# Patient Record
Sex: Male | Born: 1965
Health system: Southern US, Community
[De-identification: ages and names within clinical notes are randomized; demographics above are authoritative.]

## PROBLEM LIST (undated history)

## (undated) DIAGNOSIS — I10 Essential (primary) hypertension: Secondary | ICD-10-CM

## (undated) DIAGNOSIS — R0600 Dyspnea, unspecified: Secondary | ICD-10-CM

## (undated) HISTORY — PX: SPINAL FUSION: SHX223

## (undated) HISTORY — PX: KNEE SURGERY: SHX244

## (undated) HISTORY — DX: Dyspnea, unspecified: R06.00

---

## 1999-06-14 ENCOUNTER — Encounter: Payer: Self-pay | Admitting: Emergency Medicine

## 1999-06-14 ENCOUNTER — Emergency Department (HOSPITAL_COMMUNITY): Admission: EM | Admit: 1999-06-14 | Discharge: 1999-06-14 | Payer: Self-pay | Admitting: Emergency Medicine

## 2002-03-23 ENCOUNTER — Emergency Department (HOSPITAL_COMMUNITY): Admission: EM | Admit: 2002-03-23 | Discharge: 2002-03-23 | Payer: Self-pay | Admitting: Emergency Medicine

## 2004-01-05 ENCOUNTER — Encounter: Admission: RE | Admit: 2004-01-05 | Discharge: 2004-01-05 | Payer: Self-pay | Admitting: Cardiology

## 2004-08-15 IMAGING — CR DG CHEST 2V
2 series · 2 of 2 positions shown · non-contrast
Comparison: None.

CLINICAL DATA: Cough, congestion, shortness of breath, chest pain and tightness. 
 TWO VIEW CHEST

[view not recorded (1 of 2)]
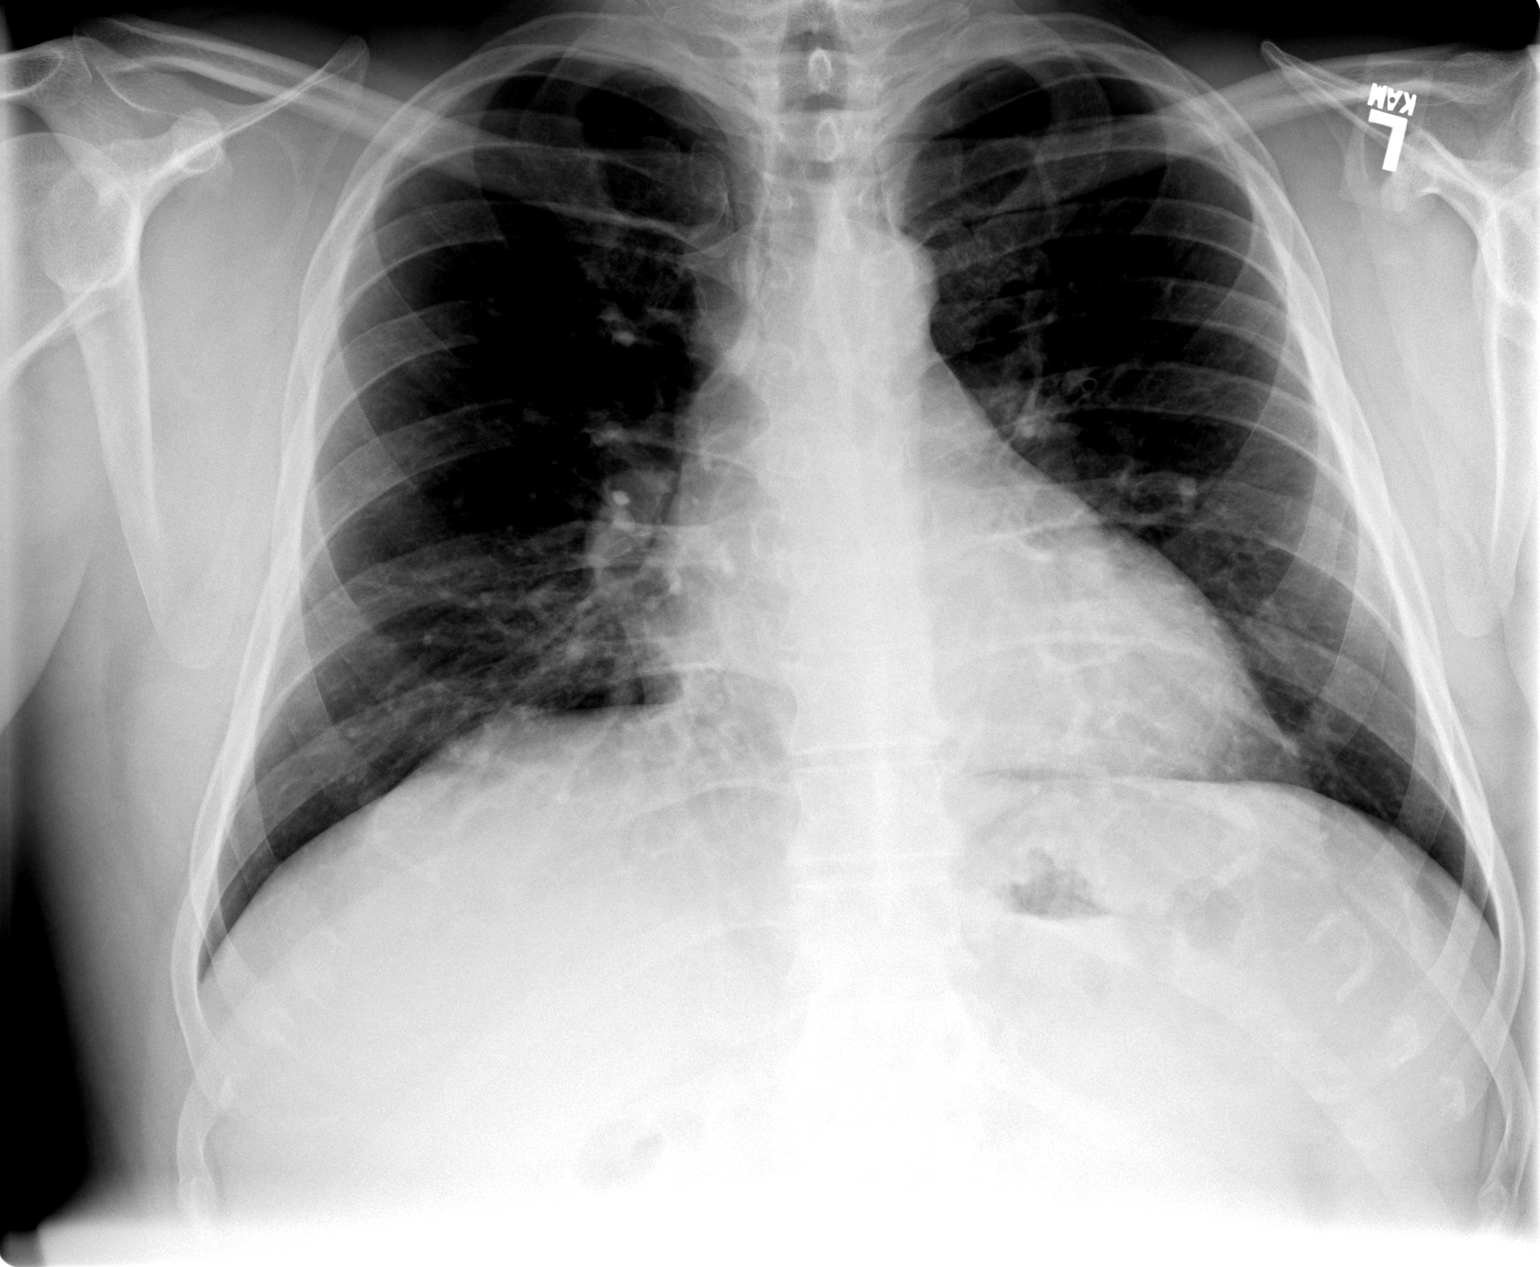

[view not recorded (2 of 2)]
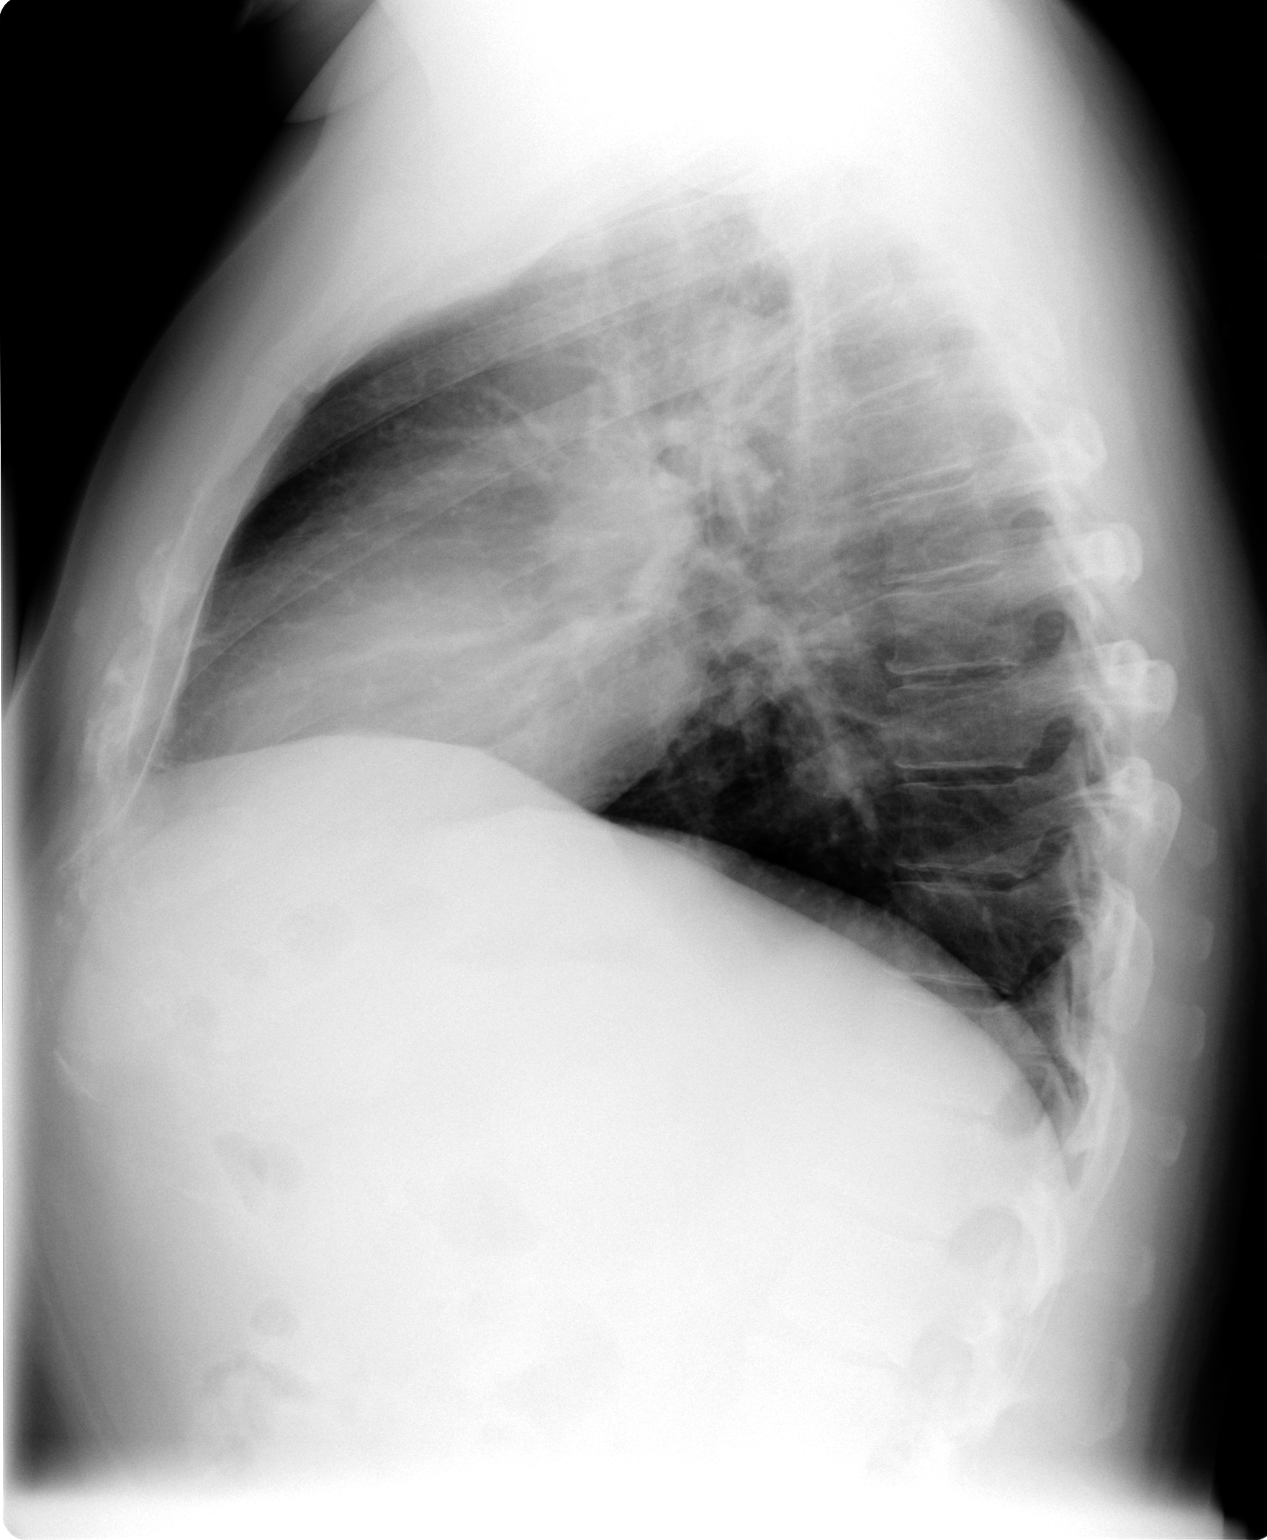

[2 of 2 positions shown; findings below may reference images not displayed]

FINDINGS: Two view chest shows low volumes which crowd lung bases.  Cardiopericardial silhouette is within normal limits for size.  There is no pulmonary edema or pleural effusion.  No evidence for focal consolidation.  Imaged bony structures are intact. 
 IMPRESSION
 Low volume film without evidence of acute cardiopulmonary process.

## 2009-07-28 ENCOUNTER — Ambulatory Visit: Payer: Self-pay | Admitting: Unknown Physician Specialty

## 2010-08-26 ENCOUNTER — Ambulatory Visit: Payer: Self-pay | Admitting: Family Medicine
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
## 2010-08-27 ENCOUNTER — Encounter (INDEPENDENT_AMBULATORY_CARE_PROVIDER_SITE_OTHER): Payer: BC Managed Care – PPO

## 2010-08-27 DIAGNOSIS — M7989 Other specified soft tissue disorders: Secondary | ICD-10-CM
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
## 2010-10-14 ENCOUNTER — Ambulatory Visit: Payer: Self-pay | Admitting: Family Medicine

## 2010-10-14 IMAGING — CR DG CHEST 2V
1 series · 3 of 3 positions shown · non-contrast
Comparison: none

REASON FOR EXAM: neck pain/chest wall pain
COMMENTS:

PROCEDURE:     MDR - MDR CHEST PA(OR AP) AND LATERAL  - [DATE]  [DATE]
RESULT:     The lung fields are clear. No pneumonia, pneumothorax or pleural
effusion is seen. The heart is upper limits for normal in size. The
mediastinal and osseous structures show no significant abnormalities.

[Series 1: view not recorded · 0.17mm/px · 3 of 3 slices shown]
[im 1/3]
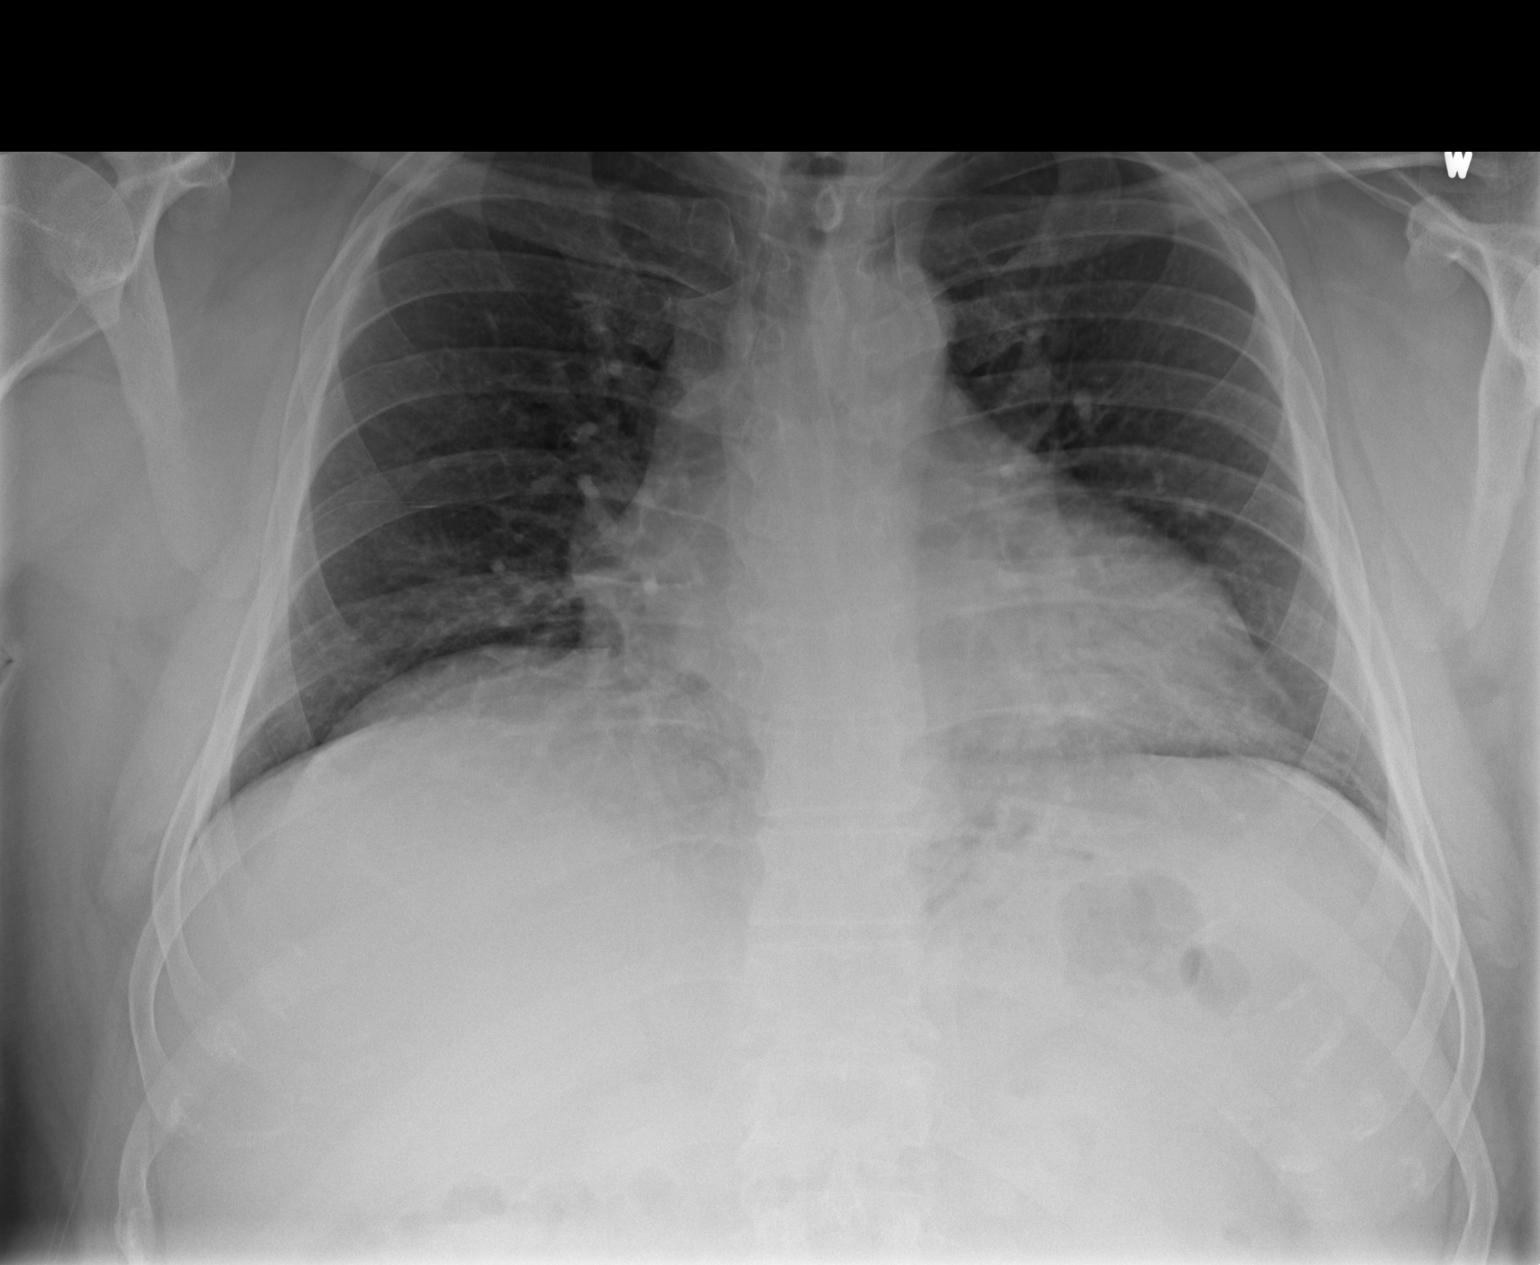
[im 2/3]
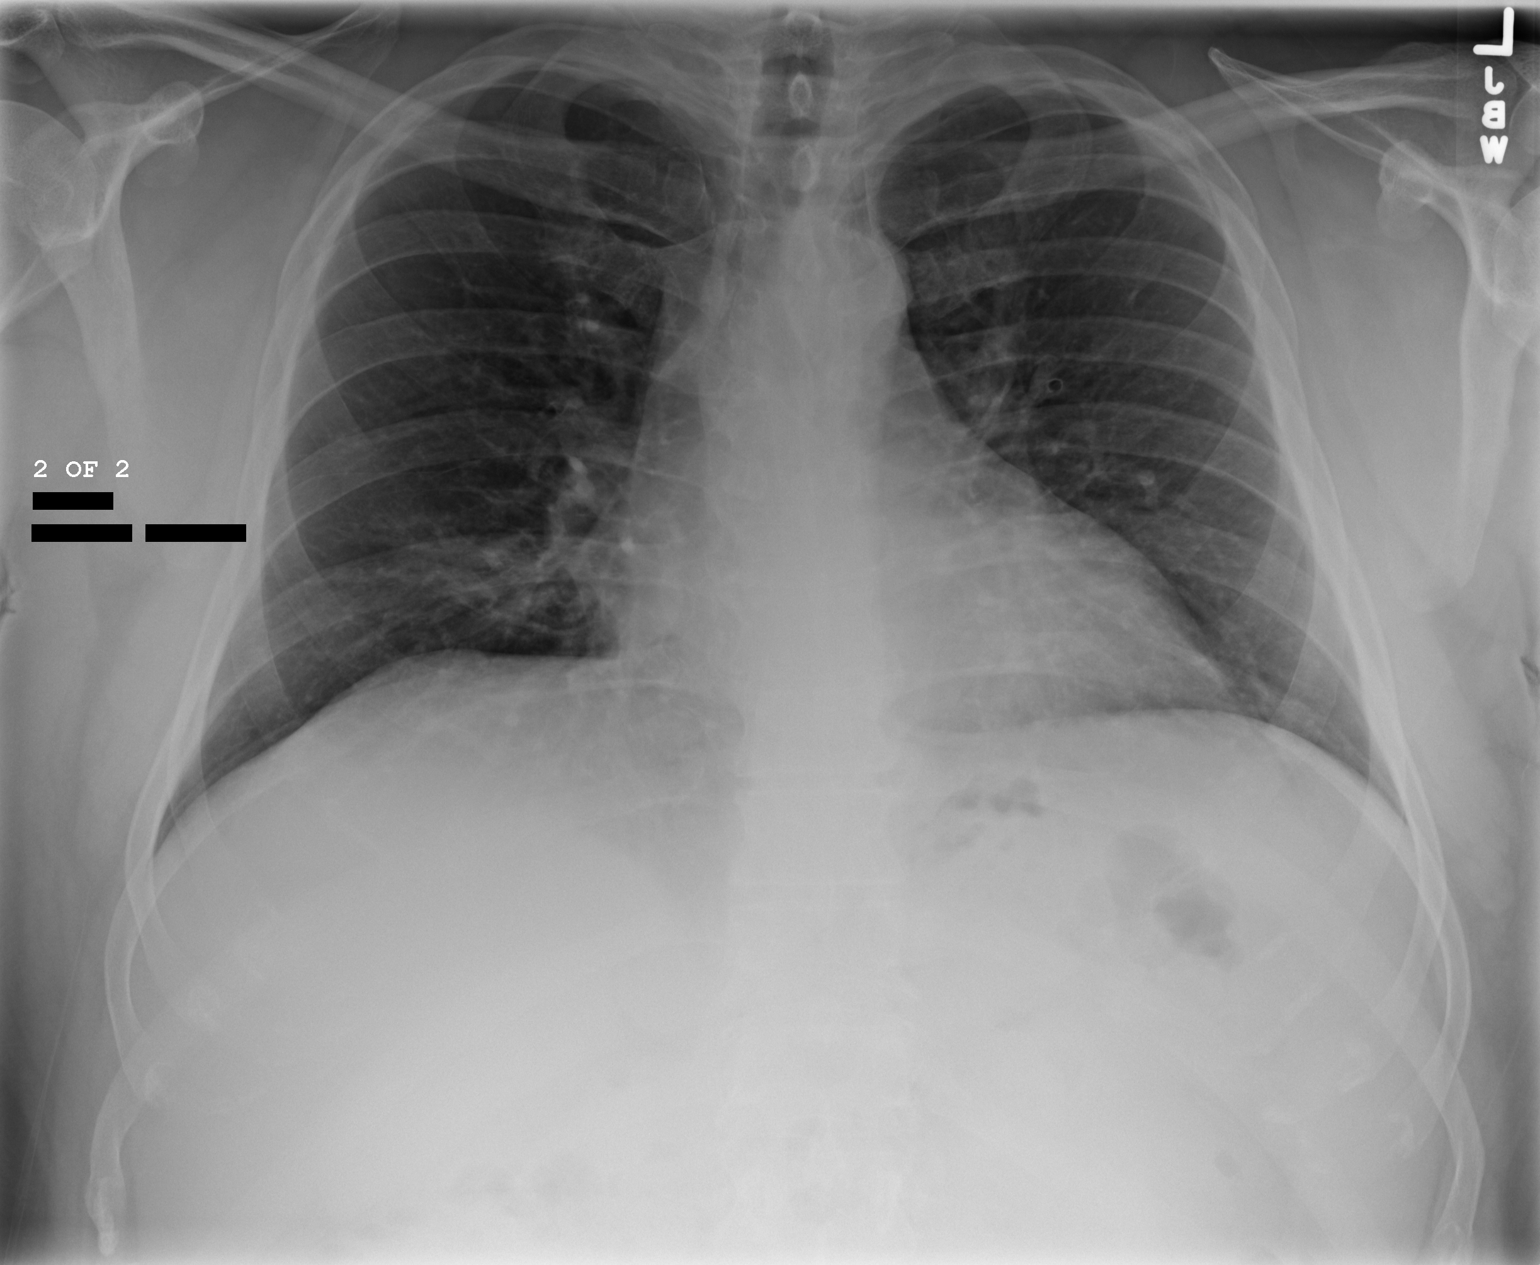
[im 3/3]
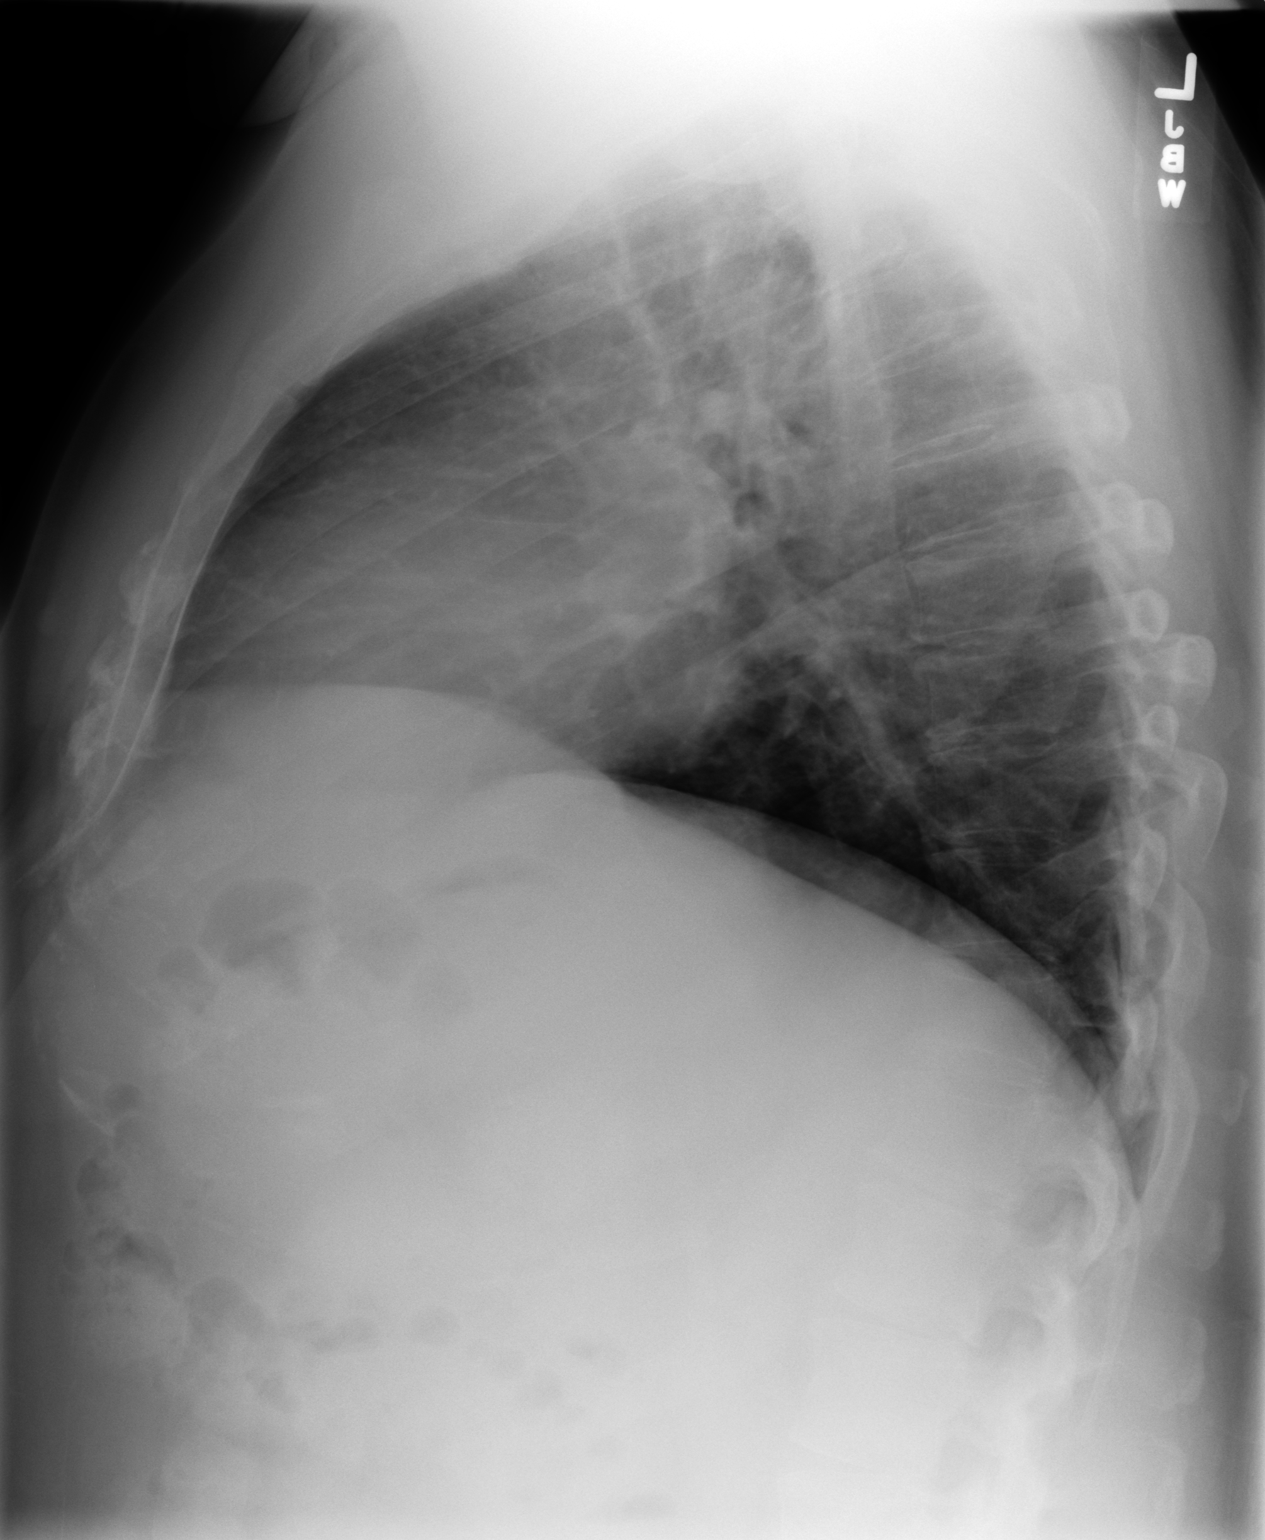

[3 of 3 positions shown; findings below may reference images not displayed]

IMPRESSION: No significant abnormalities are identified.

## 2010-10-14 IMAGING — CR CERVICAL SPINE - COMPLETE 4+ VIEW
1 series · 8 of 8 positions shown · non-contrast
Comparison: none

REASON FOR EXAM: neck pain/chest wall pain
COMMENTS:

[Series 1: view not recorded · 0.17mm/px · 8 of 8 slices shown]
[im 1/8]
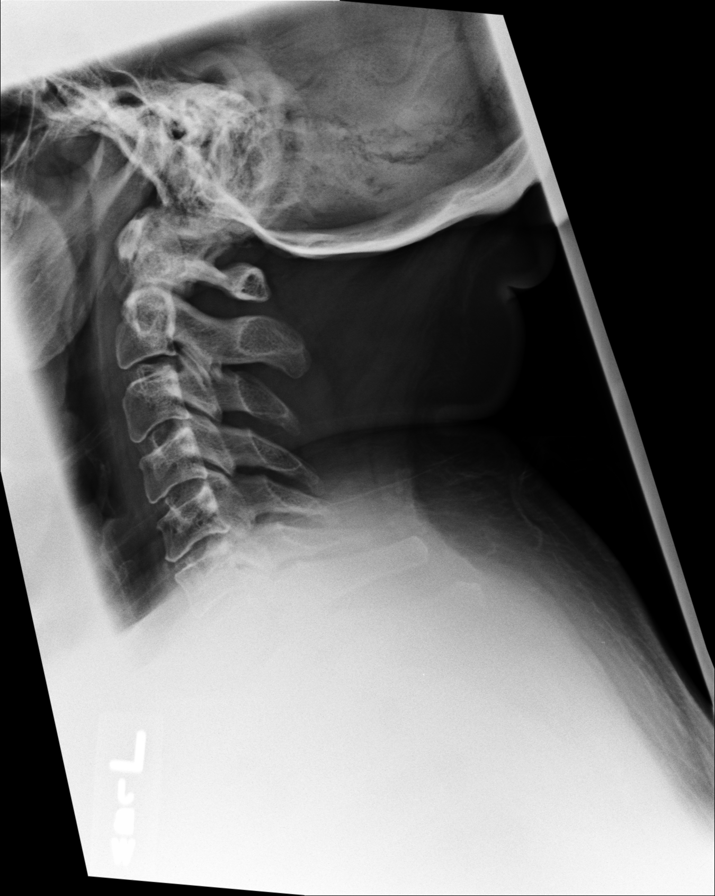
[im 2/8]
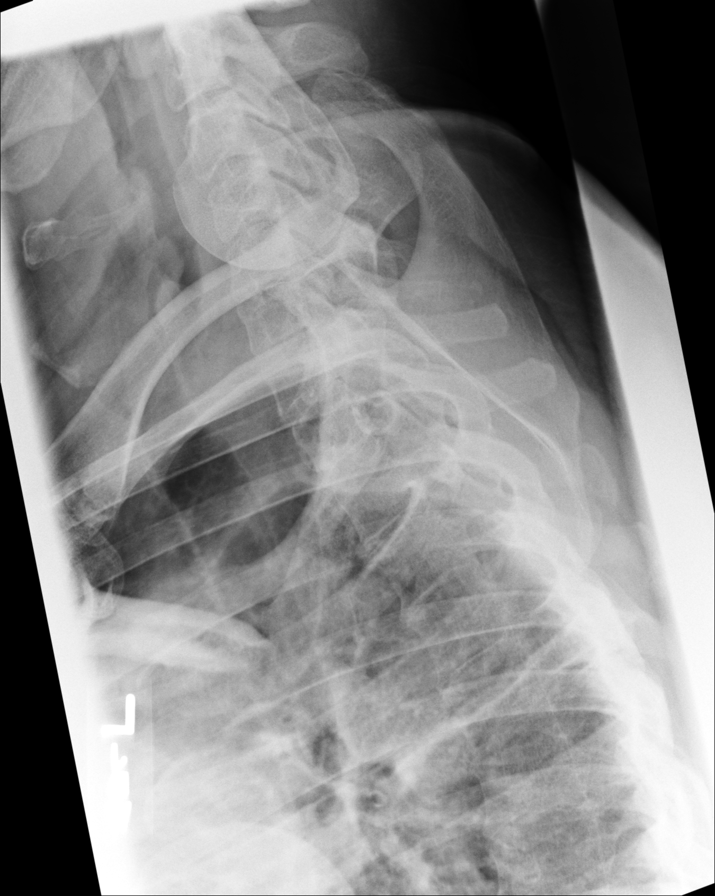
[im 3/8]
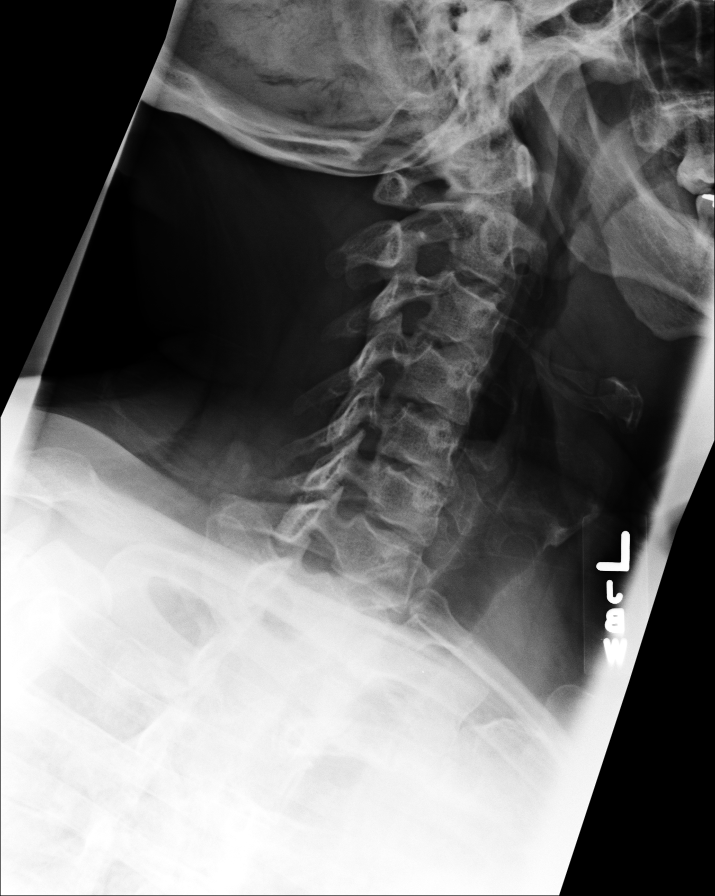
[im 4/8]
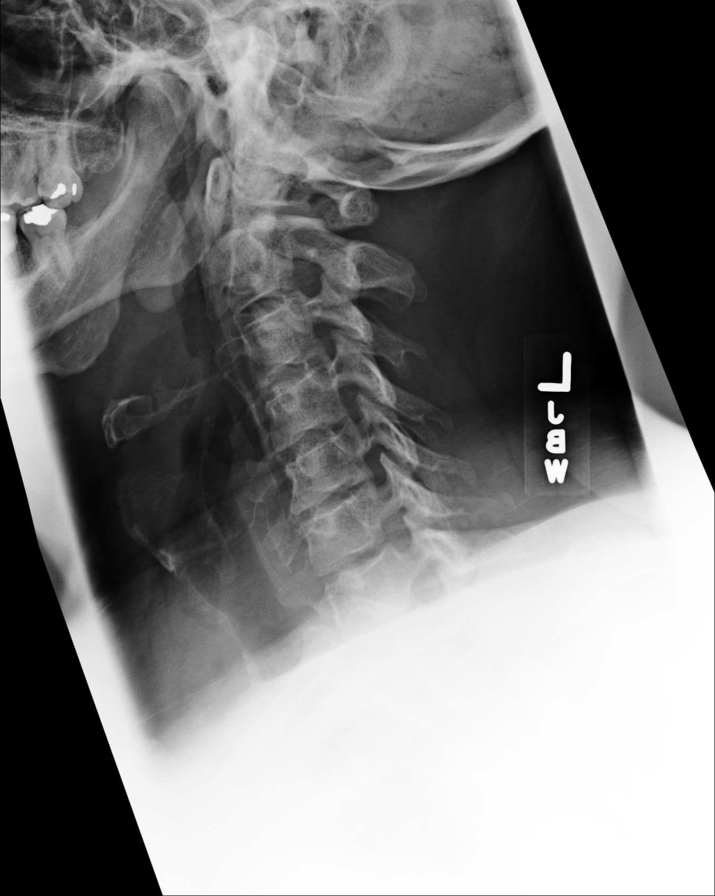
[im 5/8]
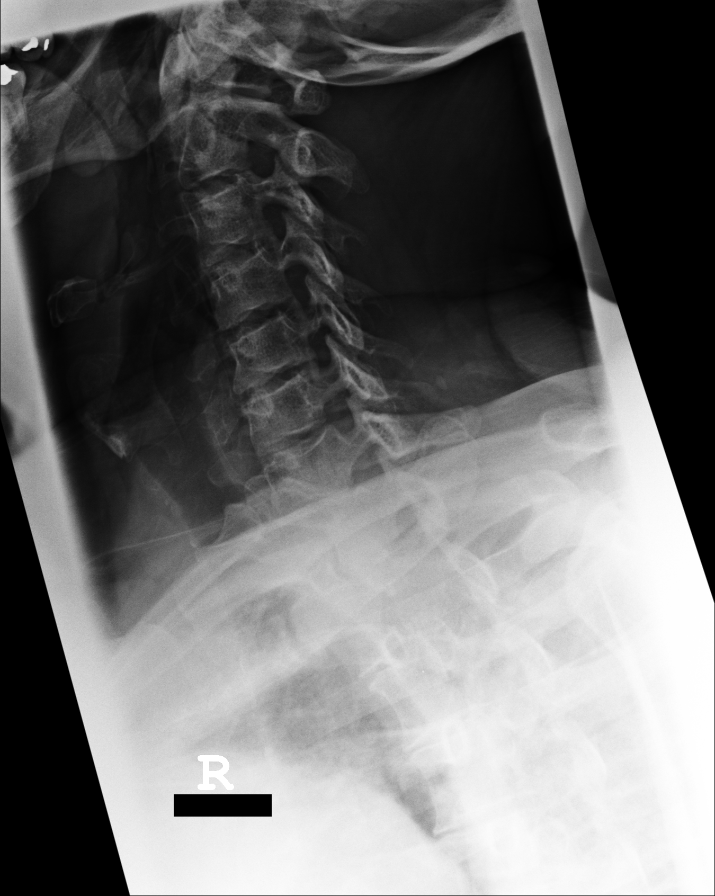
[im 6/8]
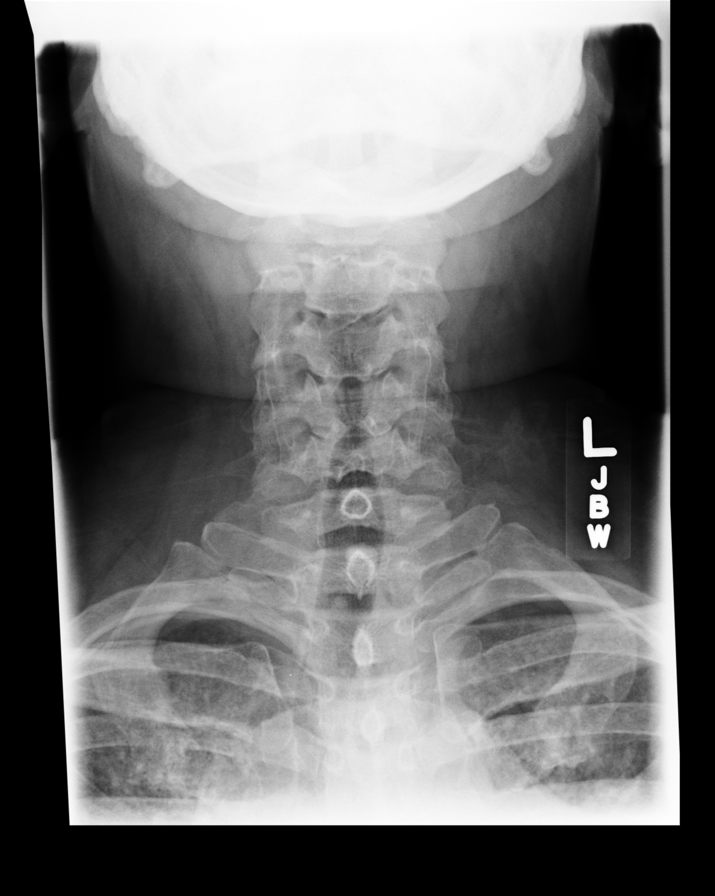
[im 7/8]
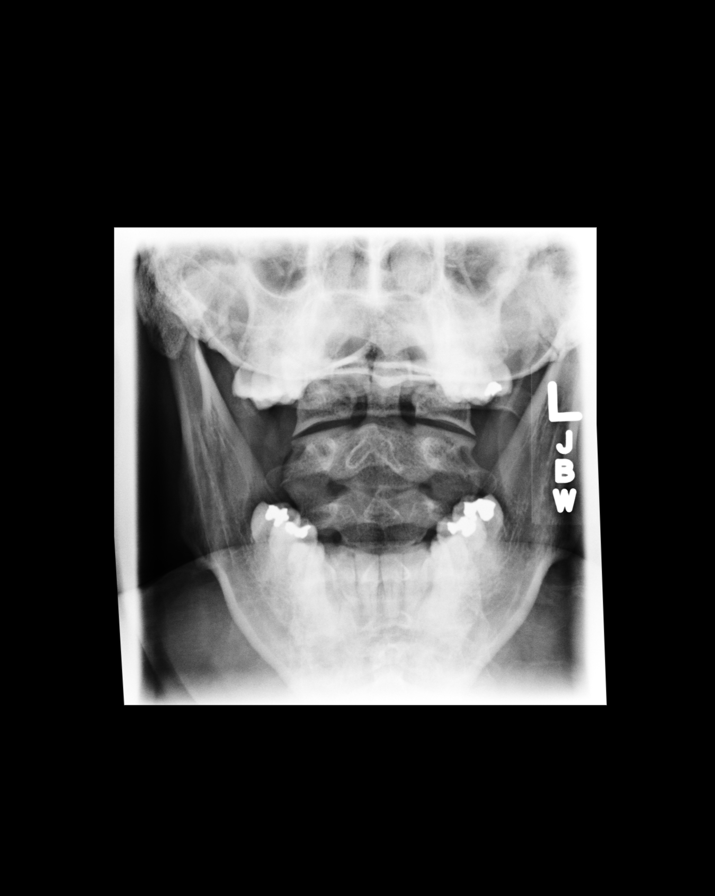
[im 8/8]
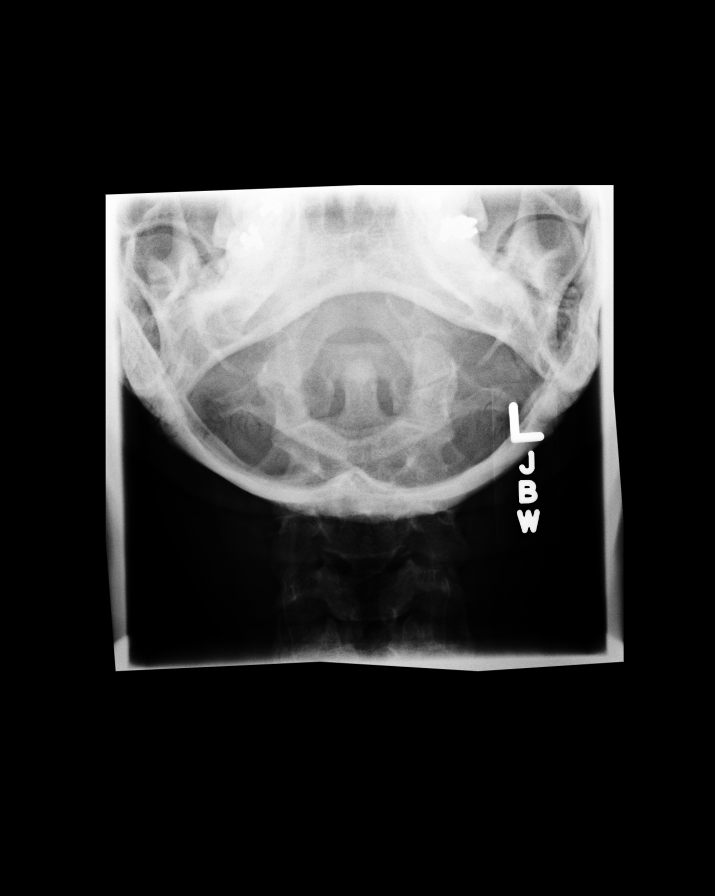

[8 of 8 positions shown; findings below may reference images not displayed]

PROCEDURE:     MDR - MDR CERVICAL SPINE COMPLETE  - [DATE]  [DATE]

RESULT:     The vertebral body heights and the intervertebral disc spaces
are well maintained. The vertebral body alignment is normal. Oblique views
show the neural foramina to be widely patent bilaterally. The odontoid
process is intact. No cervical rib formation is seen.
IMPRESSION: No acute changes are identified.

## 2014-03-17 ENCOUNTER — Encounter: Payer: Self-pay | Admitting: Internal Medicine

## 2014-06-04 DIAGNOSIS — F411 Generalized anxiety disorder: Secondary | ICD-10-CM | POA: Insufficient documentation

## 2014-06-04 DIAGNOSIS — R739 Hyperglycemia, unspecified: Secondary | ICD-10-CM | POA: Insufficient documentation

## 2014-06-04 DIAGNOSIS — I10 Essential (primary) hypertension: Secondary | ICD-10-CM | POA: Insufficient documentation

## 2014-06-04 DIAGNOSIS — K219 Gastro-esophageal reflux disease without esophagitis: Secondary | ICD-10-CM | POA: Insufficient documentation

## 2014-08-13 ENCOUNTER — Encounter: Payer: Self-pay | Admitting: Internal Medicine

## 2016-07-26 DIAGNOSIS — J3089 Other allergic rhinitis: Secondary | ICD-10-CM | POA: Insufficient documentation

## 2017-02-21 DIAGNOSIS — D480 Neoplasm of uncertain behavior of bone and articular cartilage: Secondary | ICD-10-CM | POA: Insufficient documentation

## 2017-04-07 ENCOUNTER — Other Ambulatory Visit: Payer: Self-pay | Admitting: Gastroenterology

## 2017-04-07 DIAGNOSIS — R140 Abdominal distension (gaseous): Secondary | ICD-10-CM

## 2017-04-14 ENCOUNTER — Ambulatory Visit
Admission: RE | Admit: 2017-04-14 | Discharge: 2017-04-14 | Disposition: A | Discharge status: Home / Self Care | Payer: BC Managed Care – PPO | Source: Ambulatory Visit | Attending: Gastroenterology | Admitting: Gastroenterology

## 2017-04-14 DIAGNOSIS — R140 Abdominal distension (gaseous): Secondary | ICD-10-CM

## 2017-04-14 IMAGING — RF DG UGI W/ SMALL BOWEL HIGH DENSITY
7 of 8 series · 14 of 24 positions shown · non-contrast
Comparison: Patient had a recent CT scan but not in this system.

CLINICAL DATA: Abdominal pain and bloating.

EXAM:
UPPER GI SERIES WITH SMALL BOWEL FOLLOW-THROUGH
FLUOROSCOPY TIME:  Fluoroscopy Time:  3 minutes and 0 seconds
Radiation Exposure Index (if provided by the fluoroscopic device):
609 mGy
Number of Acquired Spot Images: 2
TECHNIQUE: Combined double contrast and single contrast upper GI series using
effervescent crystals, thick barium, and thin barium. Subsequently,
serial images of the small bowel were obtained including spot views
of the terminal ileum.

[Series 1: dg abd 1 view · U · 0.14mm/px · 1 of 1 slices shown]
[im 1/1]
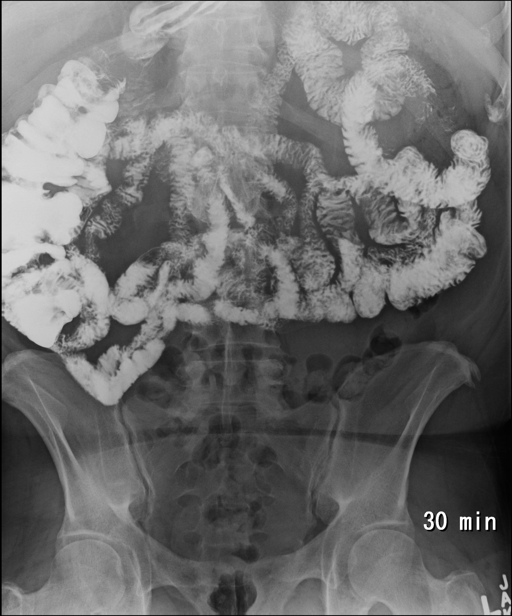

[Series 2: sequence · 2 of 17 frames shown (1 of 4)]
[frame 3/17]
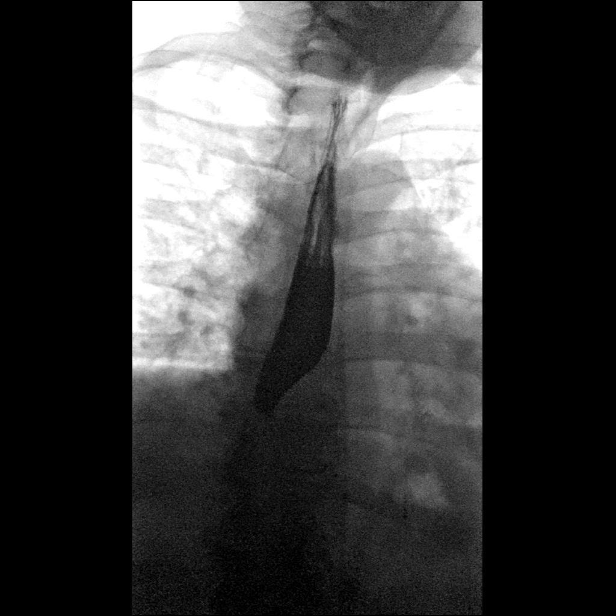
[frame 15/17]
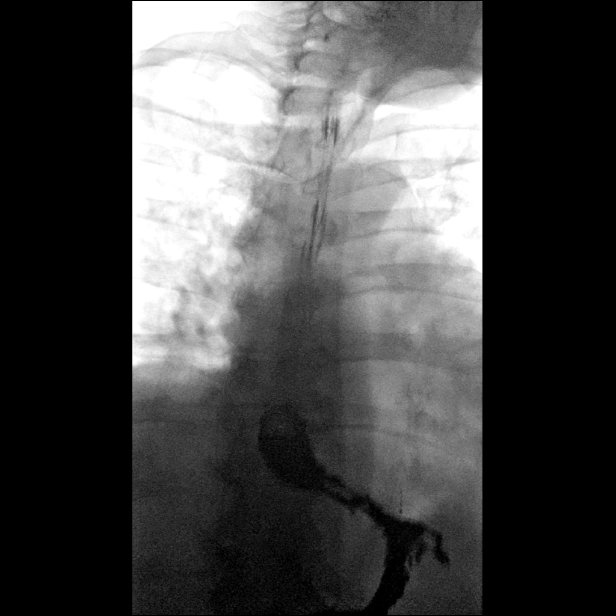

[Series 3: sequence · 1 of 17 frames shown (2 of 4)]
[frame 15/17]
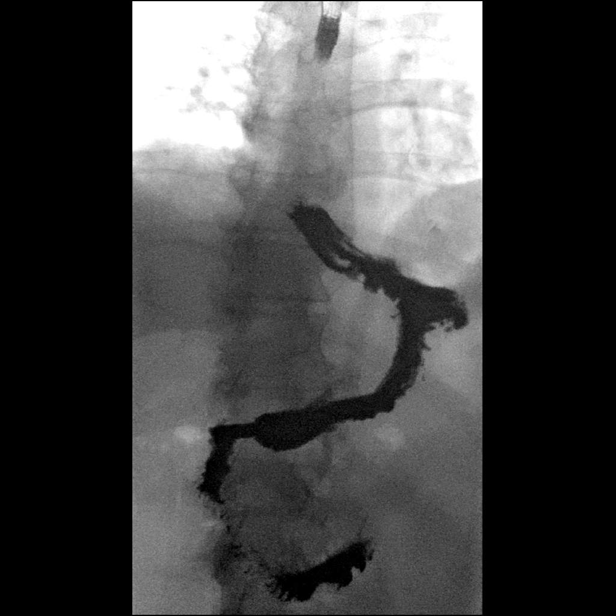

[Series 4: one shot · 1 of 3 slices shown (1 of 2)]
[im 1/3]
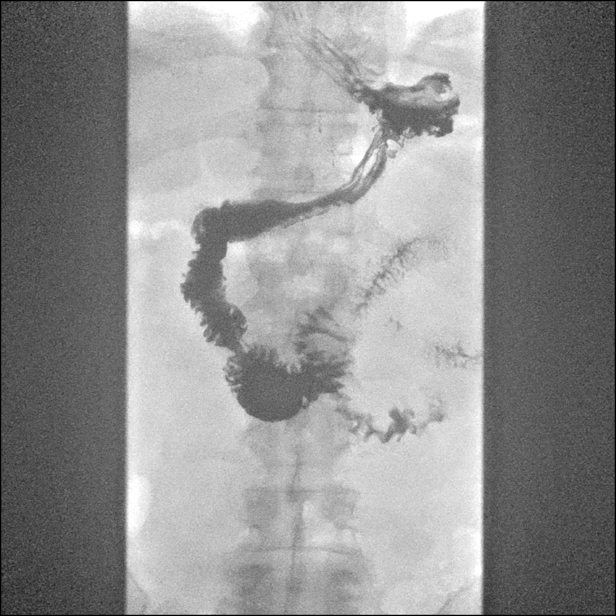

[Series 5: sequence · 2 of 20 frames shown (3 of 4)]
[frame 4/20]
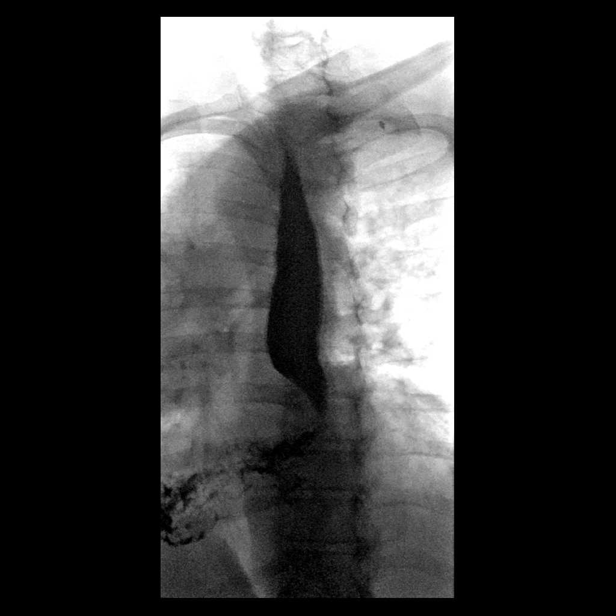
[frame 18/20]
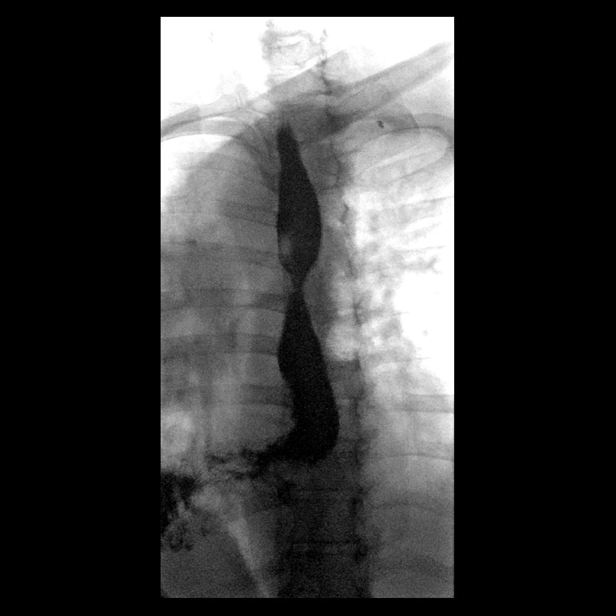

[Series 6: sequence · 2 of 25 frames shown (4 of 4)]
[frame 4/25]
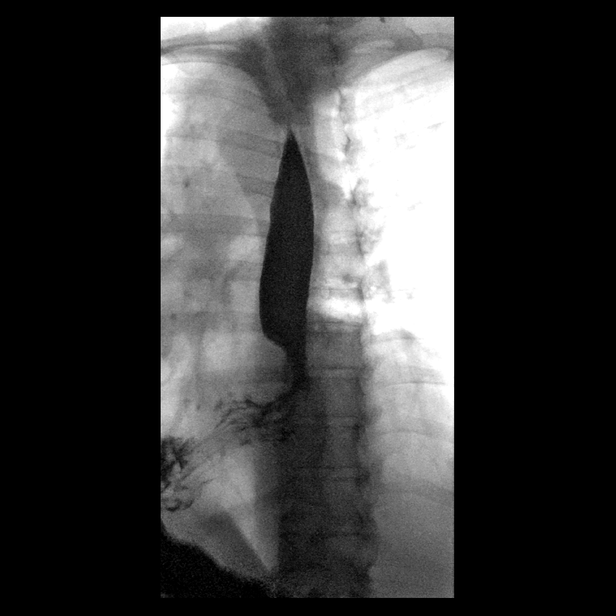
[frame 25/25]
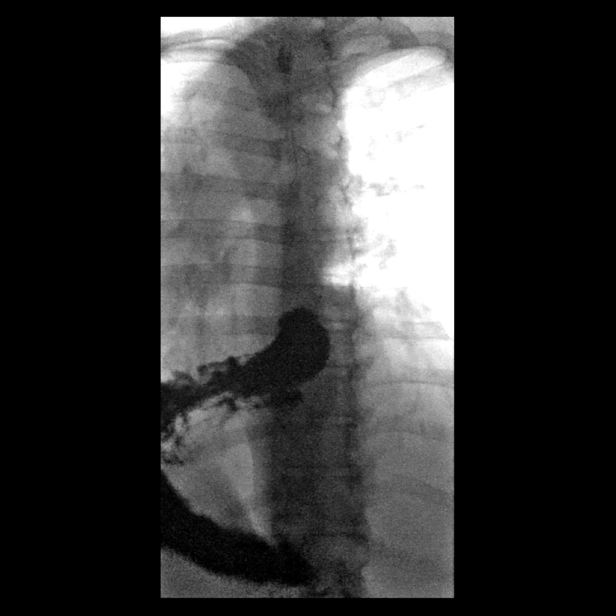

[Series 7: one shot · 5 of 14 slices shown (2 of 2)]
[im 3/14]
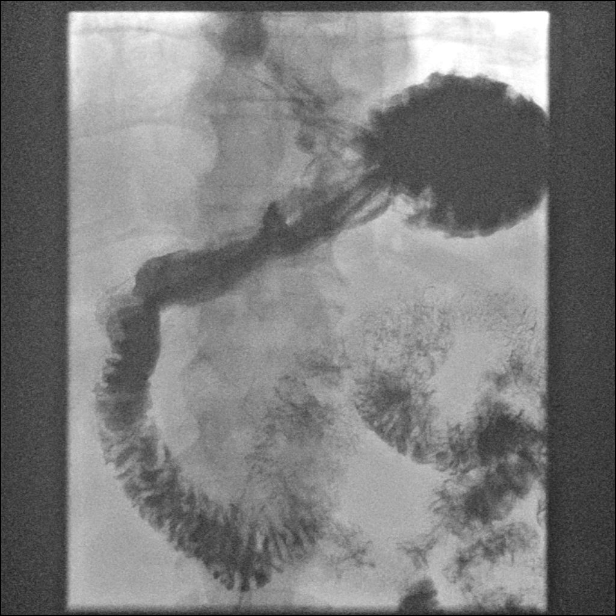
[im 6/14]
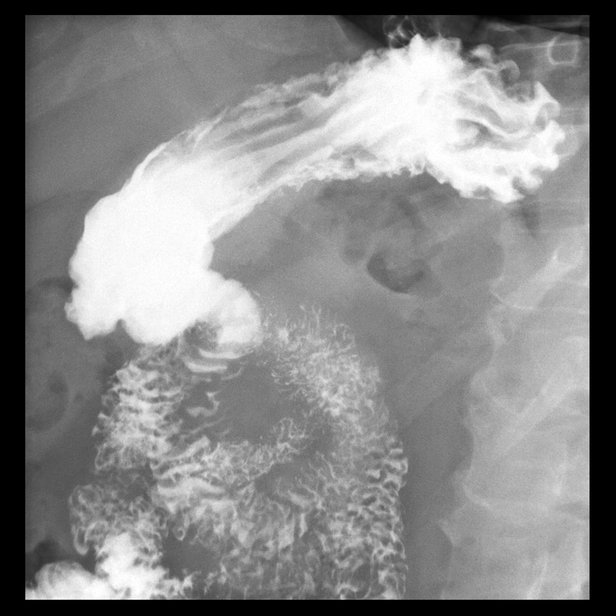
[im 8/14]
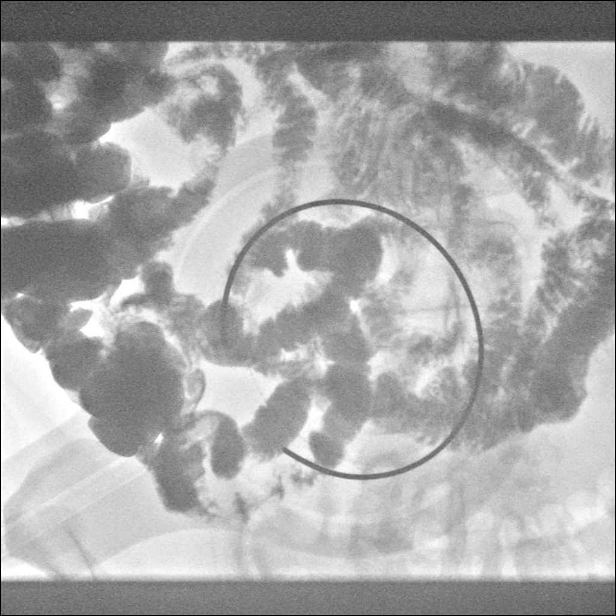
[im 11/14]
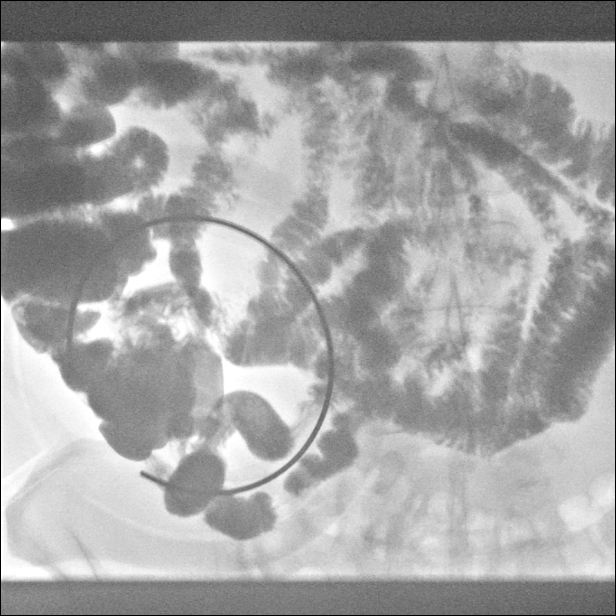
[im 14/14]
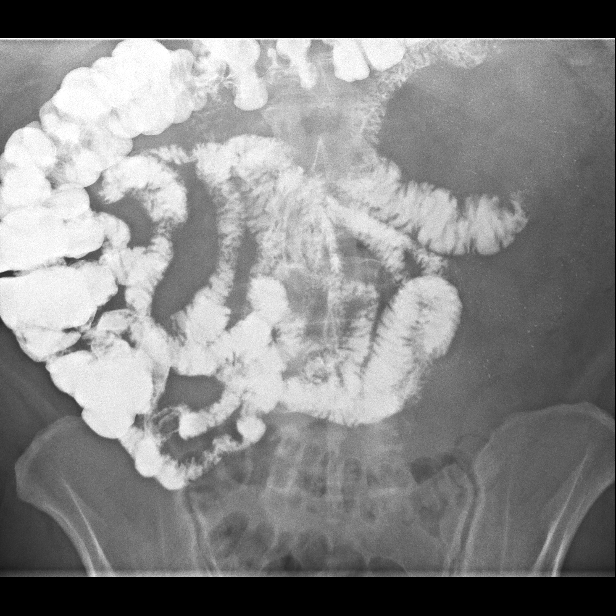

[14 of 24 positions shown; findings below may reference images not displayed]

FINDINGS: Initial barium swallows demonstrate normal esophageal motility. No
intrinsic or extrinsic lesions of the esophagus are identified. No
mass or stricture. No mucosal abnormalities. There is a small
sliding-type hiatal hernia and minimal inducible GE reflux with
water swallowing.

The stomach demonstrates normal mucosal fold pattern. Duodenal bulb
is normal. The C-loop is normal and the duodenum jejunal junction is
in its normal anatomic location.

Small bowel transit time was 30 minutes. Normal appearing loops of
jejunum in the left abdomen with normal mucosal fold pattern. Normal
ileal loops of small bowel in the central abdomen and right lower
quadrant with normal mucosal fold pattern. The terminal ileum is
normal.

Most of the small bowel is up out of the pelvis. This is most likely
due to a slightly distended bladder. Correlation with recent CT scan
is suggested. The visualized right colon is unremarkable.
IMPRESSION: 1. Small sliding-type hiatal hernia and minimal inducible GE reflux.
No esophageal mass or stricture.
2. Normal appearance of the stomach and duodenum.
3. Normal small bowel transit time and normal appearance of the
small bowel.

## 2017-11-27 ENCOUNTER — Other Ambulatory Visit: Payer: Self-pay | Admitting: Family Medicine

## 2018-11-01 DIAGNOSIS — R42 Dizziness and giddiness: Secondary | ICD-10-CM | POA: Insufficient documentation

## 2019-10-24 ENCOUNTER — Ambulatory Visit: Payer: BC Managed Care – PPO | Admitting: Neurology

## 2019-12-10 ENCOUNTER — Other Ambulatory Visit: Payer: BC Managed Care – PPO

## 2019-12-10 ENCOUNTER — Other Ambulatory Visit: Payer: Self-pay | Admitting: Critical Care Medicine

## 2019-12-10 DIAGNOSIS — Z20822 Contact with and (suspected) exposure to covid-19: Secondary | ICD-10-CM

## 2019-12-12 LAB — SARS-COV-2, NAA 2 DAY TAT

## 2019-12-12 LAB — NOVEL CORONAVIRUS, NAA: SARS-CoV-2, NAA: NOT DETECTED

## 2019-12-17 ENCOUNTER — Other Ambulatory Visit: Payer: Self-pay

## 2019-12-17 ENCOUNTER — Other Ambulatory Visit: Payer: BC Managed Care – PPO

## 2019-12-17 DIAGNOSIS — Z20822 Contact with and (suspected) exposure to covid-19: Secondary | ICD-10-CM

## 2019-12-19 LAB — NOVEL CORONAVIRUS, NAA: SARS-CoV-2, NAA: NOT DETECTED

## 2019-12-19 LAB — SARS-COV-2, NAA 2 DAY TAT

## 2019-12-24 ENCOUNTER — Other Ambulatory Visit: Payer: Self-pay

## 2019-12-24 DIAGNOSIS — Z20822 Contact with and (suspected) exposure to covid-19: Secondary | ICD-10-CM

## 2019-12-26 LAB — NOVEL CORONAVIRUS, NAA: SARS-CoV-2, NAA: NOT DETECTED

## 2019-12-26 LAB — SARS-COV-2, NAA 2 DAY TAT

## 2019-12-31 ENCOUNTER — Other Ambulatory Visit: Payer: BC Managed Care – PPO

## 2019-12-31 DIAGNOSIS — Z20822 Contact with and (suspected) exposure to covid-19: Secondary | ICD-10-CM

## 2020-01-01 LAB — NOVEL CORONAVIRUS, NAA: SARS-CoV-2, NAA: NOT DETECTED

## 2020-01-01 LAB — SARS-COV-2, NAA 2 DAY TAT

## 2020-01-07 ENCOUNTER — Other Ambulatory Visit: Payer: BC Managed Care – PPO

## 2020-01-07 DIAGNOSIS — Z20822 Contact with and (suspected) exposure to covid-19: Secondary | ICD-10-CM

## 2020-01-09 LAB — NOVEL CORONAVIRUS, NAA: SARS-CoV-2, NAA: NOT DETECTED

## 2020-01-09 LAB — SARS-COV-2, NAA 2 DAY TAT

## 2020-01-14 ENCOUNTER — Other Ambulatory Visit: Payer: BC Managed Care – PPO

## 2020-01-14 DIAGNOSIS — Z20822 Contact with and (suspected) exposure to covid-19: Secondary | ICD-10-CM

## 2020-01-16 LAB — SARS-COV-2, NAA 2 DAY TAT

## 2020-01-16 LAB — NOVEL CORONAVIRUS, NAA: SARS-CoV-2, NAA: NOT DETECTED

## 2020-03-09 ENCOUNTER — Other Ambulatory Visit: Payer: Self-pay | Admitting: Neurosurgery

## 2020-03-09 DIAGNOSIS — M5412 Radiculopathy, cervical region: Secondary | ICD-10-CM

## 2020-03-10 ENCOUNTER — Other Ambulatory Visit: Payer: BC Managed Care – PPO

## 2020-03-10 DIAGNOSIS — Z20822 Contact with and (suspected) exposure to covid-19: Secondary | ICD-10-CM

## 2020-03-12 LAB — SARS-COV-2, NAA 2 DAY TAT

## 2020-03-12 LAB — NOVEL CORONAVIRUS, NAA: SARS-CoV-2, NAA: NOT DETECTED

## 2020-03-15 ENCOUNTER — Ambulatory Visit
Admission: RE | Admit: 2020-03-15 | Discharge: 2020-03-15 | Disposition: A | Discharge status: Home / Self Care | Payer: BC Managed Care – PPO | Source: Ambulatory Visit | Attending: Neurosurgery | Admitting: Neurosurgery

## 2020-03-15 ENCOUNTER — Other Ambulatory Visit: Payer: Self-pay

## 2020-03-15 DIAGNOSIS — M5412 Radiculopathy, cervical region: Secondary | ICD-10-CM

## 2020-03-15 IMAGING — MR MR CERVICAL SPINE W/O CM
4 of 5 series · 26 of 48 positions shown · non-contrast
Comparison: Previous radiograph from [DATE]

CLINICAL DATA: Initial evaluation for right-sided neck and shoulder
pain with numbness in digits of right hand for 5 months.

EXAM:
MRI CERVICAL SPINE WITHOUT CONTRAST
TECHNIQUE: Multiplanar, multisequence MR imaging of the cervical spine was
performed. No intravenous contrast was administered.

[Series 5: T2 · sagittal · 3.0mm · 0.55mm/px · 6 of 16 slices shown (1 of 2)]
[im 1/16]
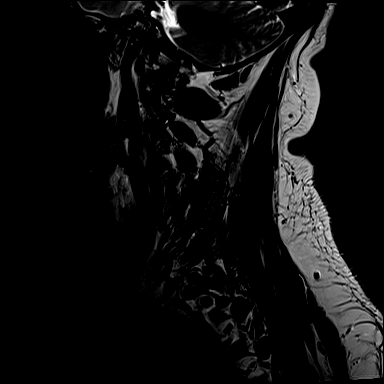
[im 4/16]
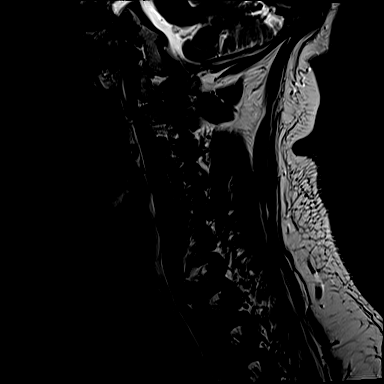
[im 7/16]
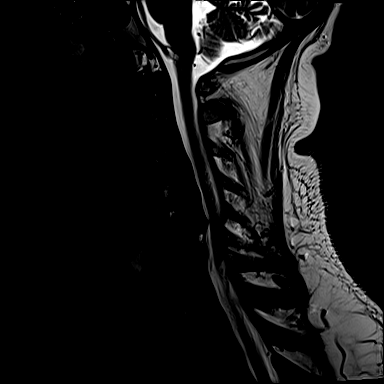
[im 10/16]
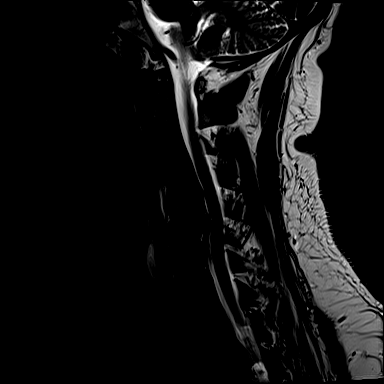
[im 13/16]
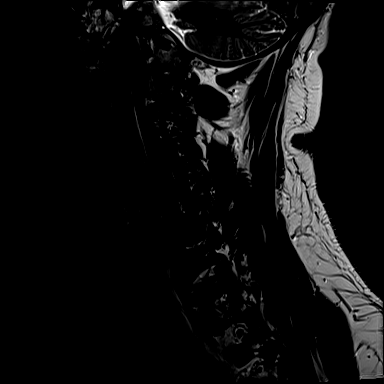
[im 16/16]
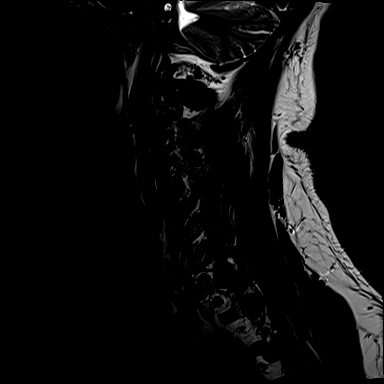

[Series 6: T1 · sagittal · 3.0mm · 0.66mm/px · 7 of 16 slices shown]
[im 1/16]
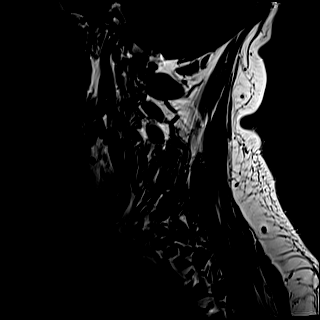
[im 3/16]
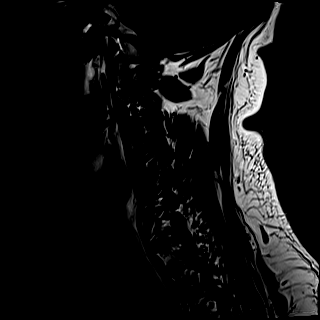
[im 6/16]
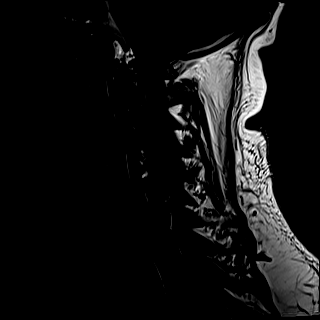
[im 8/16]
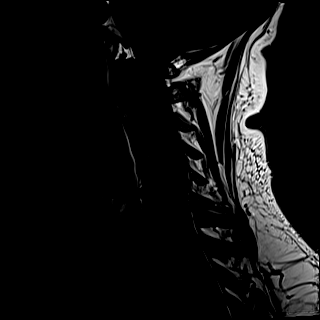
[im 11/16]
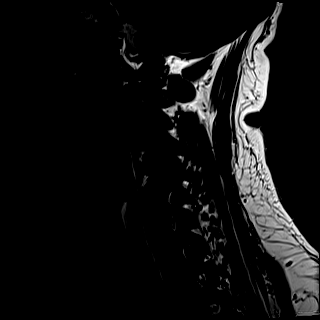
[im 13/16]
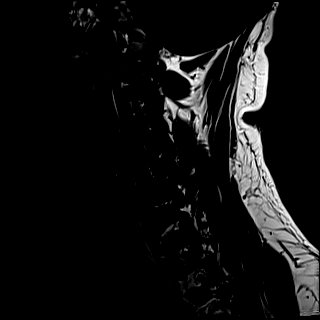
[im 16/16]
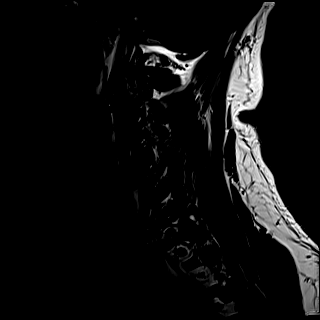

[Series 7: STIR · sagittal · 3.0mm · 0.33mm/px · 5 of 16 slices shown]
[im 1/16]
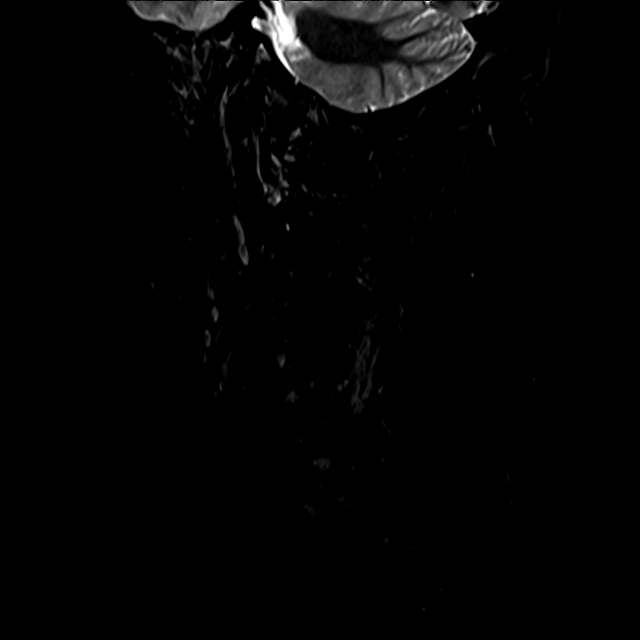
[im 3/16]
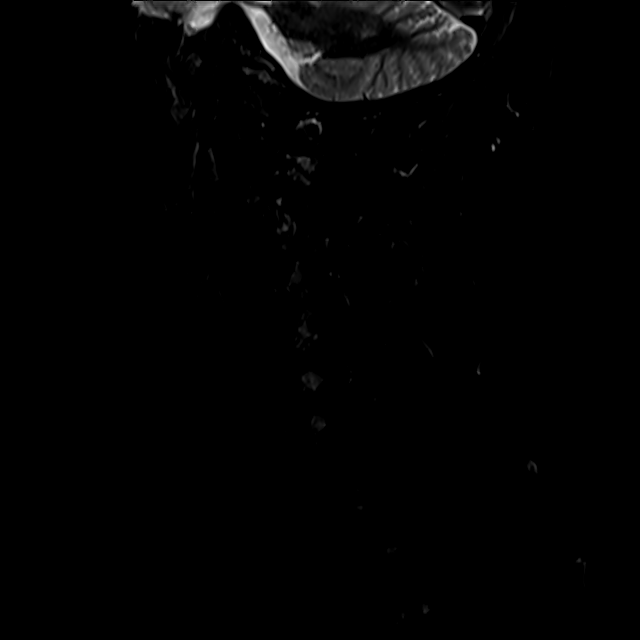
[im 6/16]
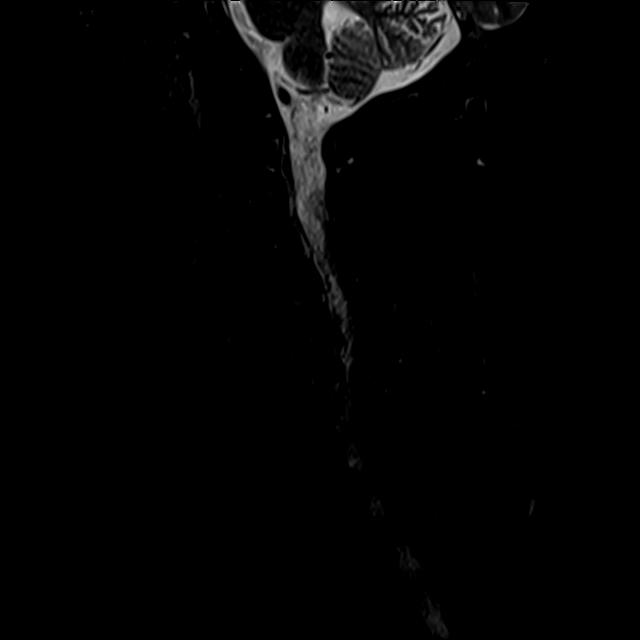
[im 8/16]
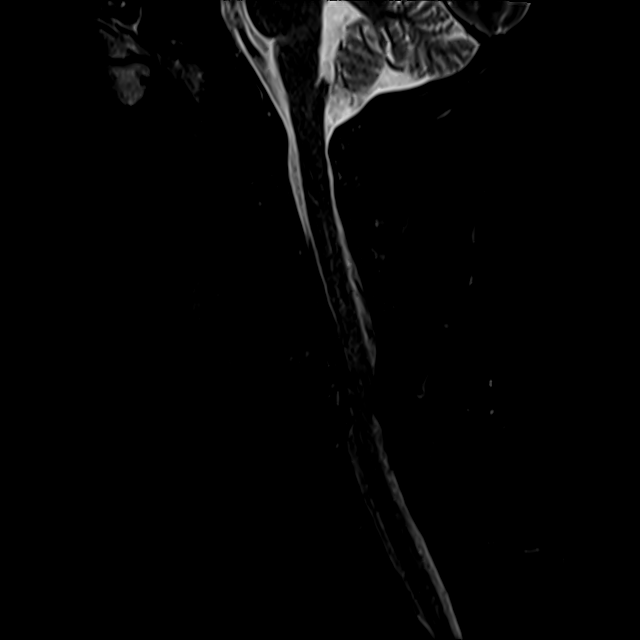
[im 13/16]
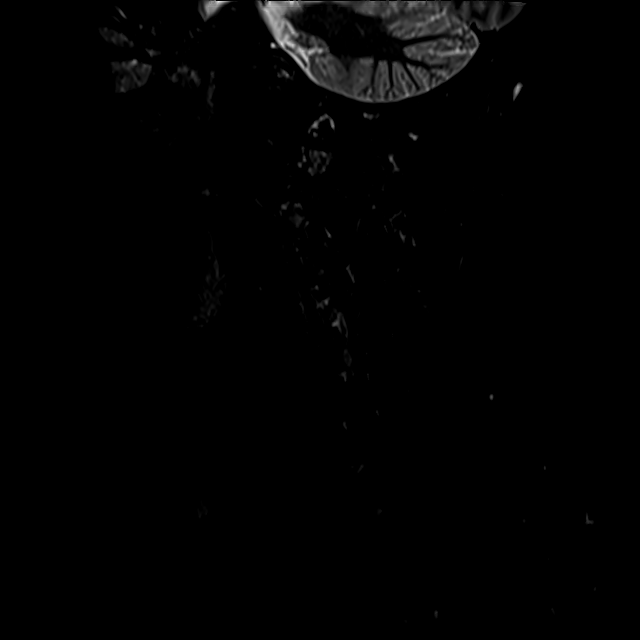

[Series 8: T2 · axial · 3.0mm · 0.50mm/px · z∈[-65,+34]mm · 8 of 32 slices shown (2 of 2)]
[im 1/32]
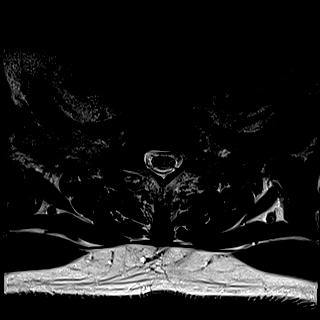
[im 5/32]
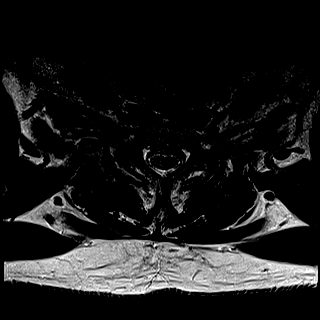
[im 10/32]
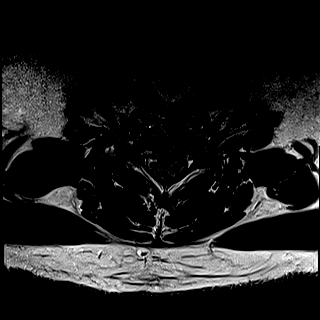
[im 15/32]
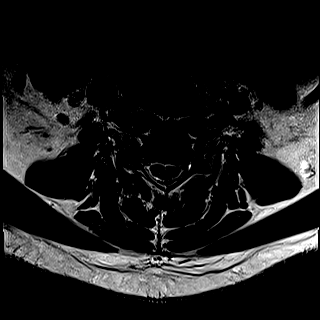
[im 17/32]
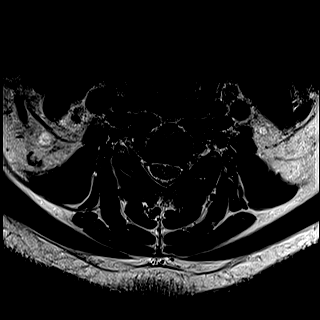
[im 22/32]
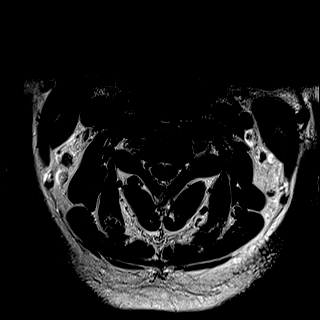
[im 27/32]
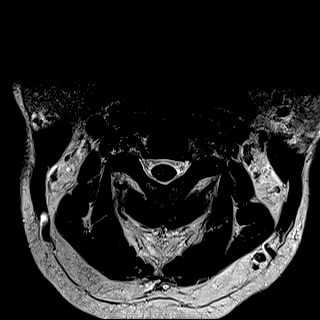
[im 32/32]
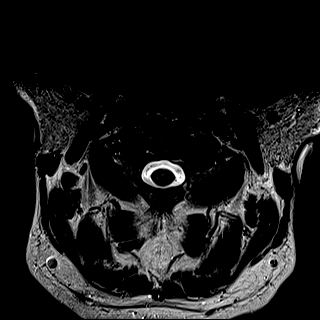

[26 of 48 positions shown; findings below may reference images not displayed]

FINDINGS: Alignment: Straightening with reversal of the normal cervical
lordosis, apex at C5-6. Trace 2 mm retrolisthesis of C5 on C6.

Vertebrae: Vertebral body height maintained without acute or chronic
fracture. Bone marrow signal intensity within normal limits. No
discrete or worrisome osseous lesions. No abnormal marrow edema.

Cord: Signal intensity within the cervical spinal cord is normal.

Posterior Fossa, vertebral arteries, paraspinal tissues: Visualized
brain and posterior fossa within normal limits. Craniocervical
junction normal. Paraspinous and prevertebral soft tissues within
normal limits. Normal flow voids seen within the vertebral arteries
bilaterally.

Disc levels:

C2-C3: Normal interspace. Mild bilateral facet hypertrophy. No canal
or foraminal stenosis.

C3-C4: Bilateral uncovertebral hypertrophy without significant disc
bulge. Mild facet degeneration. No significant spinal stenosis. Mild
bilateral C4 foraminal narrowing.

C4-C5: Right-sided uncovertebral hypertrophy with minimal disc
bulge. Mild flattening of the ventral thecal sac, asymmetric to the
left. Minimal flattening of the left ventral cord without cord
signal changes or significant spinal stenosis. Mild to moderate
right C5 foraminal stenosis. Left neural foramina remains patent.

C5-C6: Trace 2 mm retrolisthesis. Degenerative intervertebral disc
space narrowing with diffuse disc bulge and bilateral uncovertebral
spurring. Superimposed central disc protrusion indents the ventral
thecal sac, contacting and flattening the ventral cord (series 9,
image 22). Some inferior migration of disc material noted posterior
to the C6 vertebral body. Associated cord flattening without cord
signal changes, slightly worse on the right. Moderate spinal
stenosis. Right worse than left uncovertebral spurring with
resultant severe right and moderate left C6 foraminal narrowing.

C6-C7: Degenerative intervertebral disc space narrowing. Right
subarticular to foraminal disc protrusion indents the right ventral
thecal sac (series 9, images 25, 26). Disc material contacts the
right ventral cord with mild cord flattening. No cord signal
changes. Mild spinal stenosis. Superimposed right-sided
uncovertebral spurring with resultant moderate right C7 foraminal
stenosis. Mild to moderate left C7 foraminal stenosis related to
disc bulging and uncovertebral disease noted as well.

C7-T1: Normal interspace. Mild facet hypertrophy. No canal or
foraminal stenosis.

Visualized upper thoracic spine demonstrates no significant finding.
IMPRESSION: 1. Central disc protrusion with right worse than left uncovertebral
hypertrophy at C5-6 with resultant moderate spinal stenosis, with
severe right C6 foraminal narrowing. Finding could contribute to
right-sided radicular symptoms.
2. Right subarticular to foraminal disc protrusion at C6-7,
contacting and mildly flattening the right hemi cord, and resulting
in mild canal with moderate right C7 foraminal stenosis. Finding
could also contribute to right-sided symptoms.
3. Mild left eccentric disc bulge at C4-5 with resultant mild
flattening of the left ventral cord. The left C5 nerve root could be
affected.

## 2020-04-09 ENCOUNTER — Other Ambulatory Visit: Payer: BC Managed Care – PPO

## 2020-05-06 ENCOUNTER — Other Ambulatory Visit: Payer: Self-pay

## 2020-05-06 DIAGNOSIS — Z20822 Contact with and (suspected) exposure to covid-19: Secondary | ICD-10-CM

## 2020-05-07 LAB — SARS-COV-2, NAA 2 DAY TAT

## 2020-05-07 LAB — NOVEL CORONAVIRUS, NAA: SARS-CoV-2, NAA: NOT DETECTED

## 2020-05-13 ENCOUNTER — Other Ambulatory Visit: Payer: Self-pay

## 2020-05-13 DIAGNOSIS — Z20822 Contact with and (suspected) exposure to covid-19: Secondary | ICD-10-CM

## 2020-05-14 LAB — NOVEL CORONAVIRUS, NAA: SARS-CoV-2, NAA: NOT DETECTED

## 2020-05-14 LAB — SARS-COV-2, NAA 2 DAY TAT

## 2020-09-23 ENCOUNTER — Emergency Department (HOSPITAL_BASED_OUTPATIENT_CLINIC_OR_DEPARTMENT_OTHER): Payer: BC Managed Care – PPO

## 2020-09-23 ENCOUNTER — Encounter (HOSPITAL_BASED_OUTPATIENT_CLINIC_OR_DEPARTMENT_OTHER): Payer: Self-pay | Admitting: Obstetrics and Gynecology

## 2020-09-23 ENCOUNTER — Emergency Department (HOSPITAL_BASED_OUTPATIENT_CLINIC_OR_DEPARTMENT_OTHER)
Admission: EM | Admit: 2020-09-23 | Discharge: 2020-09-23 | Disposition: A | Discharge status: Home / Self Care | Payer: BC Managed Care – PPO | Attending: Emergency Medicine | Admitting: Emergency Medicine

## 2020-09-23 ENCOUNTER — Other Ambulatory Visit: Payer: Self-pay

## 2020-09-23 DIAGNOSIS — S20312A Abrasion of left front wall of thorax, initial encounter: Secondary | ICD-10-CM | POA: Insufficient documentation

## 2020-09-23 DIAGNOSIS — R519 Headache, unspecified: Secondary | ICD-10-CM | POA: Insufficient documentation

## 2020-09-23 DIAGNOSIS — I10 Essential (primary) hypertension: Secondary | ICD-10-CM | POA: Insufficient documentation

## 2020-09-23 DIAGNOSIS — M542 Cervicalgia: Secondary | ICD-10-CM | POA: Diagnosis not present

## 2020-09-23 DIAGNOSIS — H1132 Conjunctival hemorrhage, left eye: Secondary | ICD-10-CM

## 2020-09-23 DIAGNOSIS — S02832A Fracture of medial orbital wall, left side, initial encounter for closed fracture: Secondary | ICD-10-CM | POA: Diagnosis not present

## 2020-09-23 DIAGNOSIS — S0992XA Unspecified injury of nose, initial encounter: Secondary | ICD-10-CM | POA: Diagnosis present

## 2020-09-23 DIAGNOSIS — S022XXA Fracture of nasal bones, initial encounter for closed fracture: Secondary | ICD-10-CM | POA: Diagnosis not present

## 2020-09-23 DIAGNOSIS — S0285XA Fracture of orbit, unspecified, initial encounter for closed fracture: Secondary | ICD-10-CM

## 2020-09-23 HISTORY — DX: Essential (primary) hypertension: I10
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
## 2020-09-23 IMAGING — DX DG RIBS W/ CHEST 3+V*L*
3 series · 3 of 3 positions shown · non-contrast
Comparison: [DATE]

CLINICAL DATA: Fall with rib pain

EXAM:
LEFT RIBS AND CHEST - 3+ VIEW

[chest pa]
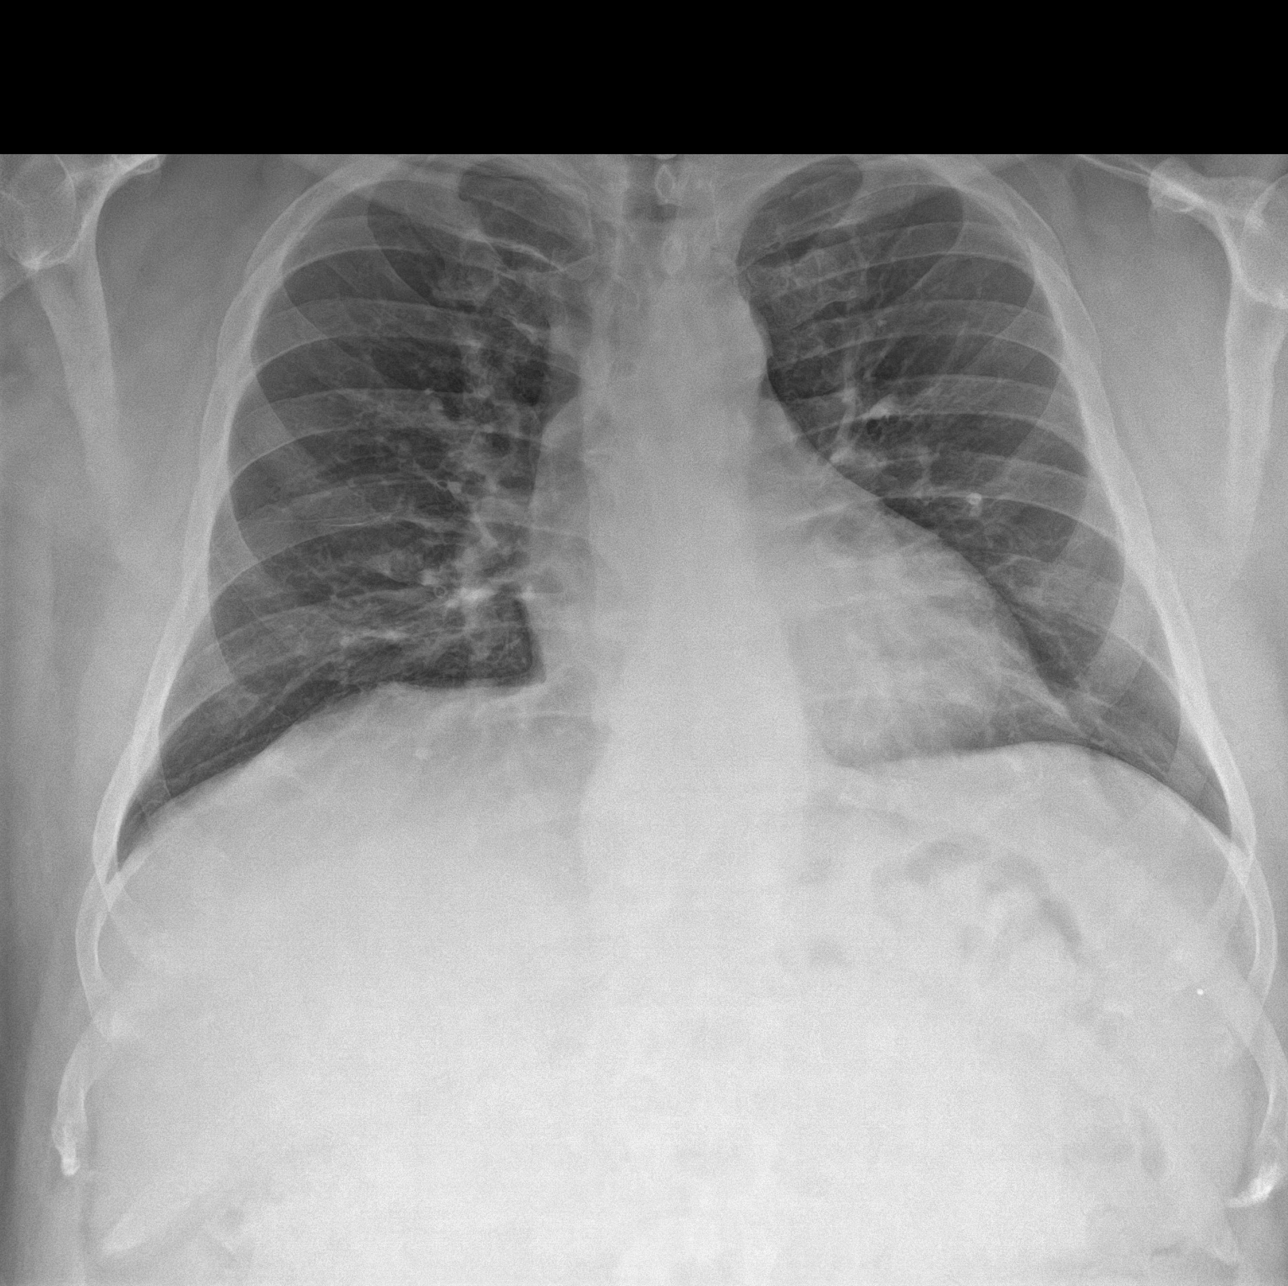

[rib ap]
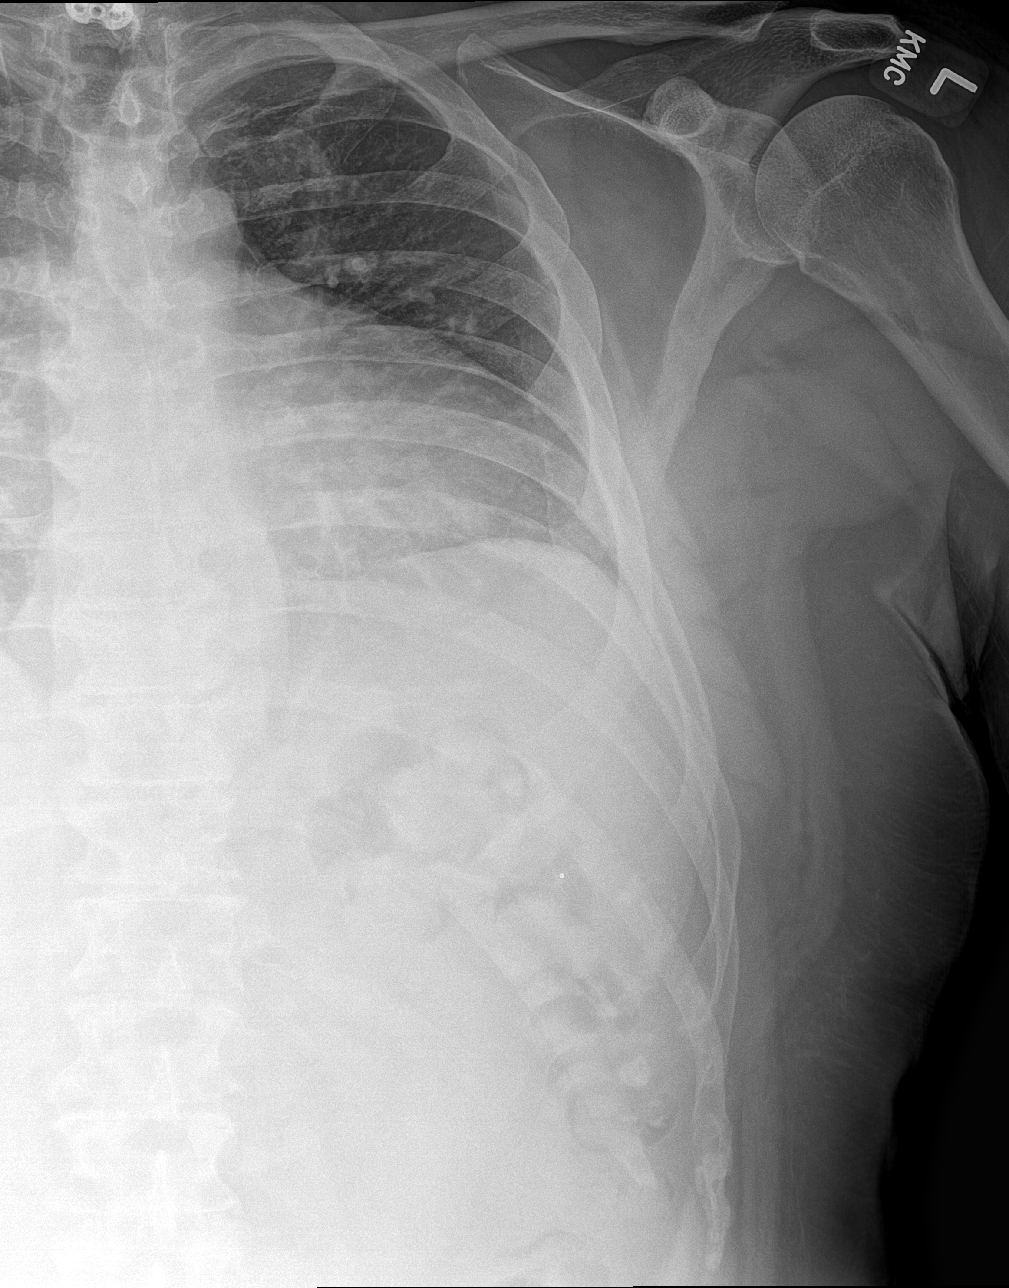

[rib pa]
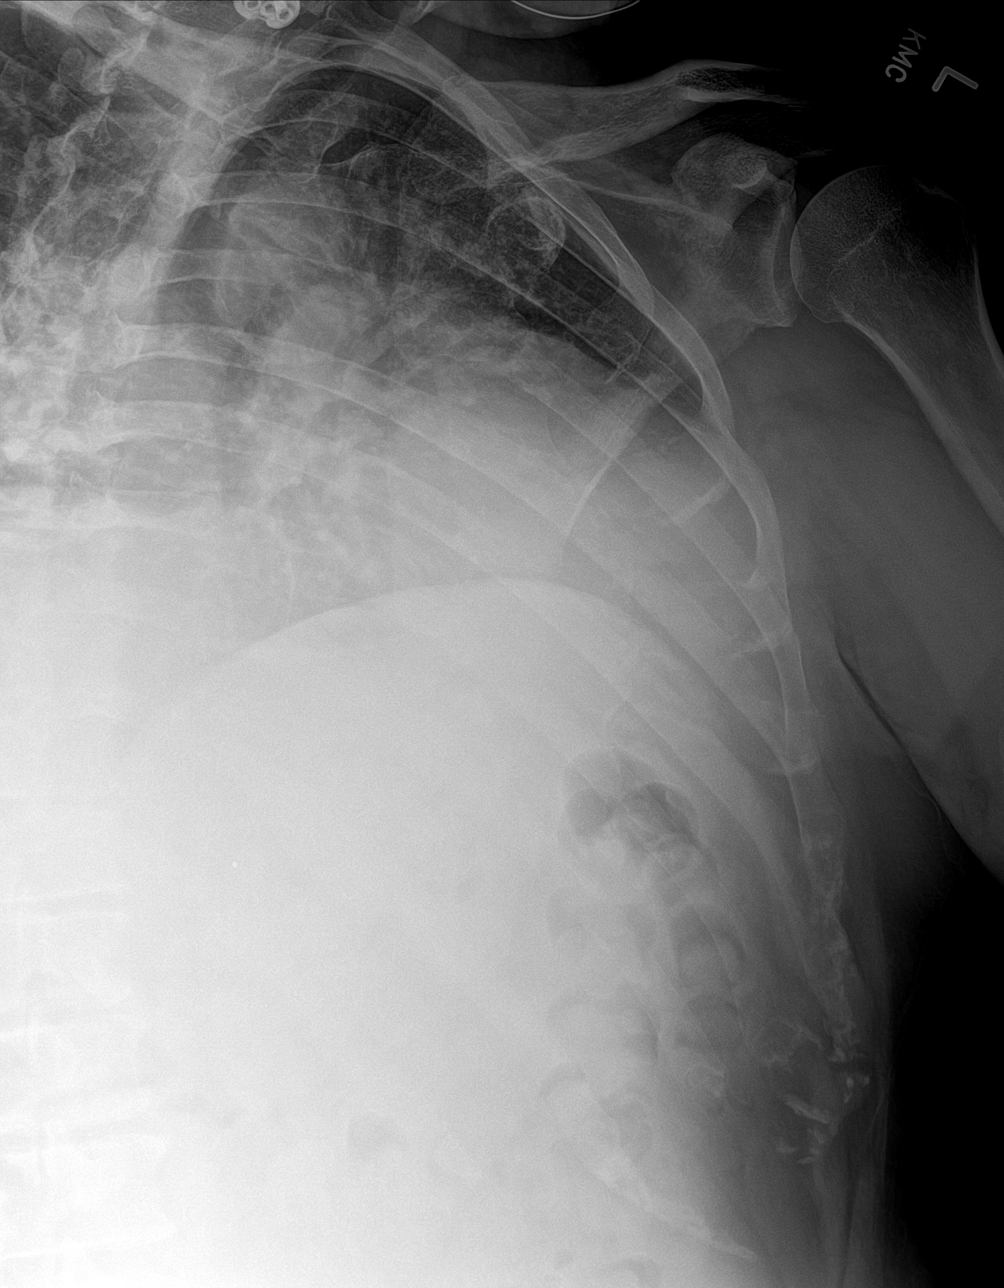

[3 of 3 positions shown; findings below may reference images not displayed]

FINDINGS: Single-view chest demonstrates no focal opacity or pleural effusion.
Normal cardiomediastinal silhouette. No pneumothorax.

Left rib series demonstrates no acute displaced left rib fracture
IMPRESSION: Negative.
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
## 2020-09-23 MED ORDER — FLUORESCEIN SODIUM 1 MG OP STRP
1.0000 | ORAL_STRIP | Freq: Once | OPHTHALMIC | Status: AC
  Administered 2020-09-23: 1 via OPHTHALMIC
  Filled 2020-09-23: qty 1

## 2020-09-23 MED ORDER — HYDROCODONE-ACETAMINOPHEN 7.5-325 MG PO TABS: 1.0000 | ORAL_TABLET | Freq: Four times a day (QID) | ORAL | 0 refills | Status: AC | PRN

## 2020-09-23 MED ORDER — HYDROCODONE-ACETAMINOPHEN 7.5-325 MG PO TABS
1.0000 | ORAL_TABLET | Freq: Once | ORAL | Status: DC
  Filled 2020-09-23: qty 1

## 2020-09-23 MED ORDER — HYDROCODONE-ACETAMINOPHEN 5-325 MG PO TABS
1.0000 | ORAL_TABLET | Freq: Once | ORAL | Status: AC
  Administered 2020-09-23: 1 via ORAL
  Filled 2020-09-23: qty 1

## 2020-09-23 MED ORDER — TETRACAINE HCL 0.5 % OP SOLN
1.0000 [drp] | Freq: Once | OPHTHALMIC | Status: AC
  Administered 2020-09-23: 1 [drp] via OPHTHALMIC
  Filled 2020-09-23: qty 4

## 2020-09-23 NOTE — Discharge Instructions (Addendum)
You have been seen and discharged from the emergency department.  Your rib x-rays were negative.  Your CT scan shows nasal bone/septal fractures and medial/inferior orbital wall fracture on the left with subconjunctival hemorrhage.  Ear nose and throat wants to see you in the office next week.  Practice safe sinus precautions, no blowing your nose or any activity that causes nasal trauma.  Continue to ice the face.  Take pain medicine as needed.  Do not mix this medication with alcohol or other sedating medications. Do not drive or do heavy physical activity and to know how this medication affects you.  It may cause drowsiness.  Follow-up with your primary provider for reevaluation and further care. Take home medications as prescribed. If you have any worsening symptoms, difficulty moving the left eye, change in vision or further concerns for your health please return to an emergency department for further evaluation.

## 2020-09-23 NOTE — ED Notes (Signed)
Pt given warm blankets & ice pack.
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
## 2021-04-16 DIAGNOSIS — Z6839 Body mass index (BMI) 39.0-39.9, adult: Secondary | ICD-10-CM | POA: Insufficient documentation

## 2021-04-16 DIAGNOSIS — R7303 Prediabetes: Secondary | ICD-10-CM | POA: Insufficient documentation

## 2021-04-26 ENCOUNTER — Other Ambulatory Visit: Payer: Self-pay | Admitting: Student

## 2021-04-26 DIAGNOSIS — M5412 Radiculopathy, cervical region: Secondary | ICD-10-CM

## 2021-04-29 ENCOUNTER — Ambulatory Visit
Admission: RE | Admit: 2021-04-29 | Discharge: 2021-04-29 | Disposition: A | Discharge status: Home / Self Care | Payer: BC Managed Care – PPO | Source: Ambulatory Visit | Attending: Student | Admitting: Student

## 2021-04-29 DIAGNOSIS — M5412 Radiculopathy, cervical region: Secondary | ICD-10-CM
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
## 2021-05-06 ENCOUNTER — Ambulatory Visit: Payer: BC Managed Care – PPO | Admitting: Dermatology

## 2021-05-06 ENCOUNTER — Other Ambulatory Visit: Payer: Self-pay

## 2021-05-06 DIAGNOSIS — L814 Other melanin hyperpigmentation: Secondary | ICD-10-CM

## 2021-05-06 DIAGNOSIS — D229 Melanocytic nevi, unspecified: Secondary | ICD-10-CM

## 2021-05-06 DIAGNOSIS — L820 Inflamed seborrheic keratosis: Secondary | ICD-10-CM

## 2021-05-06 DIAGNOSIS — L578 Other skin changes due to chronic exposure to nonionizing radiation: Secondary | ICD-10-CM | POA: Diagnosis not present

## 2021-05-06 DIAGNOSIS — Z1283 Encounter for screening for malignant neoplasm of skin: Secondary | ICD-10-CM | POA: Diagnosis not present

## 2021-05-06 DIAGNOSIS — L821 Other seborrheic keratosis: Secondary | ICD-10-CM

## 2021-05-06 DIAGNOSIS — D485 Neoplasm of uncertain behavior of skin: Secondary | ICD-10-CM | POA: Diagnosis not present

## 2021-05-06 DIAGNOSIS — L918 Other hypertrophic disorders of the skin: Secondary | ICD-10-CM

## 2021-05-06 DIAGNOSIS — D239 Other benign neoplasm of skin, unspecified: Secondary | ICD-10-CM

## 2021-05-06 DIAGNOSIS — D1800 Hemangioma unspecified site: Secondary | ICD-10-CM | POA: Diagnosis not present

## 2021-05-06 HISTORY — DX: Other benign neoplasm of skin, unspecified: D23.9

## 2021-05-06 NOTE — Patient Instructions (Addendum)
Seborrheic Keratosis  What causes seborrheic keratoses? Seborrheic keratoses are harmless, common skin growths that first appear during adult life.  As time goes by, more growths appear.  Some people may develop a large number of them.  Seborrheic keratoses appear on both covered and uncovered body parts.  They are not caused by sunlight.  The tendency to develop seborrheic keratoses can be inherited.  They vary in color from skin-colored to gray, brown, or even black.  They can be either smooth or have a rough, warty surface.   Seborrheic keratoses are superficial and look as if they were stuck on the skin.  Under the microscope this type of keratosis looks like layers upon layers of skin.  That is why at times the top layer may seem to fall off, but the rest of the growth remains and re-grows.    Treatment Seborrheic keratoses do not need to be treated, but can easily be removed in the office.  Seborrheic keratoses often cause symptoms when they rub on clothing or jewelry.  Lesions can be in the way of shaving.  If they become inflamed, they can cause itching, soreness, or burning.  Removal of a seborrheic keratosis can be accomplished by freezing, burning, or surgery. If any spot bleeds, scabs, or grows rapidly, please return to have it checked, as these can be an indication of a skin cancer.   Wound Care Instructions  Cleanse wound gently with soap and water once a day then pat dry with clean gauze. Apply a thing coat of Petrolatum (petroleum jelly, "Vaseline") over the wound (unless you have an allergy to this). We recommend that you use a new, sterile tube of Vaseline. Do not pick or remove scabs. Do not remove the yellow or white "healing tissue" from the base of the wound.  Cover the wound with fresh, clean, nonstick gauze and secure with paper tape. You may use Band-Aids in place of gauze and tape if the would is small enough, but would recommend trimming much of the tape off as there is  often too much. Sometimes Band-Aids can irritate the skin.  You should call the office for your biopsy report after 1 week if you have not already been contacted.  If you experience any problems, such as abnormal amounts of bleeding, swelling, significant bruising, significant pain, or evidence of infection, please call the office immediately.  FOR ADULT SURGERY PATIENTS: If you need something for pain relief you may take 1 extra strength Tylenol (acetaminophen) AND 2 Ibuprofen (200mg  each) together every 4 hours as needed for pain. (do not take these if you are allergic to them or if you have a reason you should not take them.) Typically, you may only need pain medication for 1 to 3 days.

## 2021-05-06 NOTE — Progress Notes (Signed)
New Patient Visit  Subjective  Reginald Hill is a 56 y.o. male who presents for the following: Spot Check (Pt has a few moles and skin tags he would like looked at today. ). The patient has spots, moles and lesions to be evaluated, some may be new or changing and the patient has concerns that these could be cancer.  Objective  Well appearing patient in no apparent distress; mood and affect are within normal limits.  A focused examination was performed including face, chest, back, arms, legs. Relevant physical exam findings are noted in the Assessment and Plan.  Right Thigh - Anterior 0.6 cm slightly irregular brown macule  left leg x 1 Erythematous keratotic or waxy stuck-on papule or plaque.   Right inner thigh x 2, left thigh x 1 (3) Fleshy, skin-colored pedunculated papules.     Assessment & Plan  Neoplasm of uncertain behavior of skin Right Thigh - Anterior  Epidermal / dermal shaving  Lesion diameter (cm):  0.6 Informed consent: discussed and consent obtained   Timeout: patient name, date of birth, surgical site, and procedure verified   Procedure prep:  Patient was prepped and draped in usual sterile fashion Prep type:  Isopropyl alcohol Anesthesia: the lesion was anesthetized in a standard fashion   Anesthetic:  1% lidocaine w/ epinephrine 1-100,000 buffered w/ 8.4% NaHCO3 Instrument used: flexible razor blade   Hemostasis achieved with: pressure, aluminum chloride and electrodesiccation   Outcome: patient tolerated procedure well   Post-procedure details: sterile dressing applied and wound care instructions given   Dressing type: bandage and petrolatum    Specimen 1 - Surgical pathology Differential Diagnosis: r/o atypia  Check Margins: yes  Inflamed seborrheic keratosis left leg x 1  Prior to procedure, discussed risks of blister formation, small wound, skin dyspigmentation, or rare scar following cryotherapy. Recommend Vaseline ointment to treated  areas while healing.   Destruction of lesion - left leg x 1 Complexity: simple   Destruction method: cryotherapy   Informed consent: discussed and consent obtained   Timeout:  patient name, date of birth, surgical site, and procedure verified Lesion destroyed using liquid nitrogen: Yes   Region frozen until ice ball extended beyond lesion: Yes   Outcome: patient tolerated procedure well with no complications   Post-procedure details: wound care instructions given    Skin tag (3) Right inner thigh x 2, left thigh x 1  Discussed would be $115 out of pocket to have removed - not covered by insurance. Pt agreed.   Epidermal / dermal shaving - Right inner thigh x 2, left thigh x 1  Informed consent: discussed and consent obtained   Patient was prepped and draped in usual sterile fashion: area prepped with alcohol. Anesthesia: the lesion was anesthetized in a standard fashion   Anesthetic:  1% lidocaine w/ epinephrine 1-100,000 buffered w/ 8.4% NaHCO3 Instrument used: scissors   Hemostasis achieved with: pressure, aluminum chloride and electrodesiccation   Outcome: patient tolerated procedure well   Post-procedure details: wound care instructions given   Post-procedure details comment:  Ointment and bandaid applied  Lentigines - Scattered tan macules - Due to sun exposure - Benign-appering, observe - Recommend daily broad spectrum sunscreen SPF 30+ to sun-exposed areas, reapply every 2 hours as needed. - Call for any changes  Hemangiomas - Red papules - Discussed benign nature - Observe - Call for any changes  Melanocytic Nevi - Tan-brown and/or pink-flesh-colored symmetric macules and papules - Benign appearing on exam today - Observation -  Call clinic for new or changing moles - Recommend daily use of broad spectrum spf 30+ sunscreen to sun-exposed areas.   Seborrheic Keratoses - Stuck-on, waxy, tan-brown papules and/or plaques  - Benign-appearing - Discussed benign  etiology and prognosis. - Observe - Call for any changes  Actinic Damage - chronic, secondary to cumulative UV radiation exposure/sun exposure over time - diffuse scaly erythematous macules with underlying dyspigmentation - Recommend daily broad spectrum sunscreen SPF 30+ to sun-exposed areas, reapply every 2 hours as needed.  - Recommend staying in the shade or wearing long sleeves, sun glasses (UVA+UVB protection) and wide brim hats (4-inch brim around the entire circumference of the hat). - Call for new or changing lesions.  Return if symptoms worsen or fail to improve, for pending path report.  IHarriett Sine, CMA, am acting as scribe for Sarina Ser, MD. Documentation: I have reviewed the above documentation for accuracy and completeness, and I agree with the above.  Sarina Ser, MD

## 2021-05-09 ENCOUNTER — Encounter: Payer: Self-pay | Admitting: Dermatology

## 2021-05-11 ENCOUNTER — Telehealth: Payer: Self-pay

## 2021-05-11 NOTE — Telephone Encounter (Signed)
-----   Message from Ralene Bathe, MD sent at 05/11/2021  9:29 AM EST ----- Diagnosis Skin , right thigh- anterior DYSPLASTIC COMPOUND NEVUS WITH MILD ATYPIA, DEEP MARGIN INVOLVED  Mild dysplastic Recheck next visit - Make pt appt for no later than 1 year

## 2021-05-11 NOTE — Telephone Encounter (Signed)
LM on VM please return my call to discuss biopsy results  

## 2021-06-01 ENCOUNTER — Telehealth: Payer: Self-pay

## 2021-06-01 NOTE — Telephone Encounter (Signed)
Left message on voicemail to return my call.  

## 2021-06-01 NOTE — Telephone Encounter (Signed)
-----   Message from Ralene Bathe, MD sent at 05/11/2021  9:29 AM EST ----- Diagnosis Skin , right thigh- anterior DYSPLASTIC COMPOUND NEVUS WITH MILD ATYPIA, DEEP MARGIN INVOLVED  Mild dysplastic Recheck next visit - Make pt appt for no later than 1 year

## 2021-06-03 ENCOUNTER — Telehealth: Payer: Self-pay

## 2021-06-03 NOTE — Telephone Encounter (Signed)
-----   Message from Ralene Bathe, MD sent at 05/11/2021  9:29 AM EST ----- ?Diagnosis ?Skin , right thigh- anterior ?DYSPLASTIC COMPOUND NEVUS WITH MILD ATYPIA, DEEP MARGIN INVOLVED ? ?Mild dysplastic ?Recheck next visit - Make pt appt for no later than 1 year ?

## 2021-06-03 NOTE — Telephone Encounter (Signed)
Advised patient of results and scheduled 1 year follow up/hd ?

## 2022-01-13 DIAGNOSIS — E559 Vitamin D deficiency, unspecified: Secondary | ICD-10-CM | POA: Insufficient documentation

## 2022-01-24 ENCOUNTER — Telehealth: Payer: Self-pay | Admitting: Internal Medicine

## 2022-01-24 NOTE — Telephone Encounter (Signed)
   Pre-operative Risk Assessment    Patient Name: Reginald Hill  DOB: 1965/05/07 MRN: [id number]      Request for Surgical Clearance    Procedure:   Right fourth toe cellulitis   Date of Surgery:  Clearance 01/28/22                                 Surgeon:  Dr. Emogene Morgan Daws  Surgeon's Group or Practice Name:  [facility]  Phone number:  [phone] Fax number:  [phone]   Type of Clearance Requested:   Medical    Type of Anesthesia:   not sure yet    Additional requests/questions:    [name]   01/24/2022, 2:46 PM

## 2022-01-24 NOTE — Telephone Encounter (Signed)
See notes from surgeon office calling with clarification. Pt has NEW PT APPT 01/27/22 with Dr. Ali Lowe. I will add need pre op clearance to appt notes.

## 2022-01-24 NOTE — Telephone Encounter (Signed)
Reginald Hill is calling back regarding this clearance stating the procedure is Right fourth mtpj arthrotomy with extensor tendon lengthening, possible distal metatarsal osteotomy bone biopsy fourth toe and the anesthesia type is general at this time.

## 2022-01-24 NOTE — Telephone Encounter (Signed)
Preoperative team, right fourth toe cellulitis is not a procedure or surgery.  It is a skin infection.  Please contact requesting office and ask for details surrounding procedure.  Patient has never been seen by cardiology.  If he is having a procedure/surgery he will need an in office visit.  Thank you for your help.  Jossie Ng. Corey Laski NP-C     01/24/2022, 3:18 PM [address] Office [phone] Fax [phone]

## 2022-01-25 NOTE — Progress Notes (Unsigned)
Cardiology Office Note:    Date:  01/27/2022   ID:  TIMMEY LAMBA, DOB 02-22-1966, MRN [id number]  PCP:  Chesley Noon, MD   [place] [facility] Cardiologist:  Lenna Sciara, MD Referring MD: Matthew Saras, PA*   Chief Complaint/Reason for Referral:  Dyspnea/pre-operative evaluation  ASSESSMENT:    1. Dyspnea, unspecified type   2. Preoperative cardiovascular examination   3. Primary hypertension   4. BMI 39.0-39.9,adult   5. Coronary artery calcification seen on CAT scan   6. Hyperlipidemia LDL goal <70     PLAN:    In order of problems listed above: 1.  Dyspnea: We will obtain a coronary CTA and echocardiogram to evaluate further.  If the patient has mild obstructive coronary artery disease, they will require a statin (with goal LDL < 70) and aspirin, if they have high-grade disease we will need to consider optimal medical therapy and if symptoms are refractory to medical therapy, then a cardiac catheterization with possible PCI will be pursued to alleviate symptoms.  If they have high risk disease we will proceed directly to cardiac catheterization.   2.  Preoperative cardiovascular evaluation: See discussion above. 3.  Hypertension: Blood pressures well controlled on his current regimen. 4.  Elevated BMI: This may be causing his dyspnea particularly if his echocardiogram and CT scan are reassuring.  He has gained some weight over the last few months. 5.  Coronary artery calcification: Start aspirin 81 mg and statin. 6.  Hyperlipidemia: We will start low-dose statin pleiotropic effects.  His LDL recently was less than 70.  We will check LP(a) in the future.             Dispo:  Return in about 6 months (around 07/29/2022).      Medication Adjustments/Labs and Tests Ordered: Current medicines are reviewed at length with the patient today.  Concerns regarding medicines are outlined above.  The following changes have been made:     Labs/tests  ordered: Orders Placed This Encounter  Procedures   CT CORONARY MORPH W/CTA COR W/SCORE W/CA W/CM &/OR WO/CM   EKG 12-Lead   ECHOCARDIOGRAM COMPLETE    Medication Changes: Meds ordered this encounter  Medications   metoprolol tartrate (LOPRESSOR) 100 MG tablet    Sig: Take 1 tablet (100 mg total) by mouth once for 1 dose. Take 90-120 minutes prior to scan.    Dispense:  1 tablet    Refill:  0   aspirin EC 81 MG tablet    Sig: Take 1 tablet (81 mg total) by mouth daily. Swallow whole.    Dispense:  90 tablet    Refill:  3   atorvastatin (LIPITOR) 10 MG tablet    Sig: Take 1 tablet (10 mg total) by mouth daily.    Dispense:  90 tablet    Refill:  3     Current medicines are reviewed at length with the patient today.  The patient does not have concerns regarding medicines.   History of Present Illness:    FOCUSED PROBLEM LIST:   Hypertension BMI of 39  The patient is a 56 y.o. male with the indicated medical history here for recommendations regarding dyspnea.  Additionally the patient is to undergo orthopedic surgery under general anesthesia on his right foot.          Current Medications: Current Meds  Medication Sig   amLODipine (NORVASC) 5 MG tablet Take 5 mg by mouth daily.   aspirin EC 81 MG tablet  Take 1 tablet (81 mg total) by mouth daily. Swallow whole.   atorvastatin (LIPITOR) 10 MG tablet Take 1 tablet (10 mg total) by mouth daily.   esomeprazole (NEXIUM) 40 MG capsule Take 40 mg by mouth daily.   hydrochlorothiazide (HYDRODIURIL) 25 MG tablet Take 25 mg by mouth daily.   methocarbamol (ROBAXIN) 500 MG tablet Take 500 mg by mouth 4 (four) times daily.   metoprolol tartrate (LOPRESSOR) 100 MG tablet Take 1 tablet (100 mg total) by mouth once for 1 dose. Take 90-120 minutes prior to scan.   olmesartan (BENICAR) 40 MG tablet Take 40 mg by mouth daily.   oxyCODONE (OXY IR/ROXICODONE) 5 MG immediate release tablet Take 5 mg by mouth every 4 (four) hours as  needed.   sertraline (ZOLOFT) 100 MG tablet Take 100 mg by mouth daily.   Vitamin D, Ergocalciferol, (DRISDOL) 1.25 MG (50000 UNIT) CAPS capsule Take 50,000 Units by mouth once a week.   [DISCONTINUED] colchicine 0.6 MG tablet Take 1 tablet by mouth daily.   [DISCONTINUED] levofloxacin (LEVAQUIN) 750 MG tablet Take 750 mg by mouth daily.   [DISCONTINUED] lisinopril (ZESTRIL) 40 MG tablet Take 40 mg by mouth daily.   [DISCONTINUED] omeprazole (PRILOSEC) 40 MG capsule Take 1 capsule by mouth daily.     Allergies:    Patient has no known allergies.   Social History:   Social History   Tobacco Use   Smoking status: Never   Smokeless tobacco: Never  Vaping Use   Vaping Use: Never used  Substance Use Topics   Alcohol use: Yes    Comment: Social   Drug use: Never     Family Hx: Family History  Problem Relation Age of Onset   Heart attack Father      Review of Systems:   Please see the history of present illness.    All other systems reviewed and are negative.     EKGs/Labs/Other Test Reviewed:    EKG:  EKG performed today that I personally reviewed demonstrates normal sinus rhythm with possible left atrial enlargement with isolated q wave in lead III.  Prior CV studies: Calcium score CT 2023 (CareEverywhere): IMPRESSION: The total coronary calcium score is 30 which is between the 25th and 50th percentile for a male patient this age.   Other studies Reviewed: Review of the additional studies/records demonstrates: No imaging available that demonstrates aortic atherosclerosis or coronary artery calcification  Recent Labs: No results found for requested labs within last 365 days.   Recent Lipid Panel No results found for: "CHOL", "TRIG", "HDL", "LDLCALC", "LDLDIRECT"  Labs in care everywhere demonstrate an LDL of 55.  Risk Assessment/Calculations:                Physical Exam:    VS:  BP 120/82 (BP Location: Left Arm, Patient Position: Sitting)   Pulse 78   Ht  '5\' 10"'$  (1.778 m)   Wt 281 lb 3.2 oz (127.6 kg)   SpO2 95%   BMI 40.35 kg/m    Wt Readings from Last 3 Encounters:  01/27/22 281 lb 3.2 oz (127.6 kg)  09/23/20 267 lb (121.1 kg)    GENERAL:  No apparent distress, AOx3 HEENT:  No carotid bruits, +2 carotid impulses, no scleral icterus CAR: RRR no murmurs, gallops, rubs, or thrills RES:  Clear to auscultation bilaterally ABD:  Soft, nontender, nondistended, positive bowel sounds x 4 VASC:  +2 radial pulses, +2 carotid pulses, palpable pedal pulses NEURO:  CN 2-12 grossly intact;  motor and sensory grossly intact PSYCH:  No active depression or anxiety EXT:  No edema, ecchymosis, or cyanosis  Signed, Early Osmond, MD  01/27/2022 11:33 AM    [address], [place], [place]  [address] Phone: [phone]; Fax: [phone]   Note:  This document was prepared using Dragon voice recognition software and may include unintentional dictation errors.

## 2022-01-25 NOTE — Telephone Encounter (Signed)
Will route to surgical team so they are aware and remove from preop pool.  Loel Dubonnet, NP

## 2022-01-27 ENCOUNTER — Encounter: Payer: Self-pay | Admitting: Internal Medicine

## 2022-01-27 ENCOUNTER — Ambulatory Visit: Payer: BC Managed Care – PPO | Attending: Cardiology | Admitting: Internal Medicine

## 2022-01-27 VITALS — BP 120/82 | HR 78 | Ht 70.0 in | Wt 281.2 lb

## 2022-01-27 DIAGNOSIS — R0600 Dyspnea, unspecified: Secondary | ICD-10-CM | POA: Diagnosis not present

## 2022-01-27 DIAGNOSIS — I10 Essential (primary) hypertension: Secondary | ICD-10-CM

## 2022-01-27 DIAGNOSIS — Z6839 Body mass index (BMI) 39.0-39.9, adult: Secondary | ICD-10-CM | POA: Diagnosis not present

## 2022-01-27 DIAGNOSIS — Z01810 Encounter for preprocedural cardiovascular examination: Secondary | ICD-10-CM | POA: Diagnosis not present

## 2022-01-27 DIAGNOSIS — R202 Paresthesia of skin: Secondary | ICD-10-CM | POA: Insufficient documentation

## 2022-01-27 DIAGNOSIS — M5412 Radiculopathy, cervical region: Secondary | ICD-10-CM | POA: Insufficient documentation

## 2022-01-27 DIAGNOSIS — Z981 Arthrodesis status: Secondary | ICD-10-CM | POA: Insufficient documentation

## 2022-01-27 DIAGNOSIS — I2510 Atherosclerotic heart disease of native coronary artery without angina pectoris: Secondary | ICD-10-CM

## 2022-01-27 DIAGNOSIS — G5600 Carpal tunnel syndrome, unspecified upper limb: Secondary | ICD-10-CM | POA: Insufficient documentation

## 2022-01-27 DIAGNOSIS — E785 Hyperlipidemia, unspecified: Secondary | ICD-10-CM

## 2022-01-27 MED ORDER — METOPROLOL TARTRATE 100 MG PO TABS: 100.0000 mg | ORAL_TABLET | Freq: Once | ORAL | 0 refills | Status: DC

## 2022-01-27 MED ORDER — ASPIRIN 81 MG PO TBEC: 81.0000 mg | DELAYED_RELEASE_TABLET | Freq: Every day | ORAL | 3 refills | Status: AC

## 2022-01-27 MED ORDER — ATORVASTATIN CALCIUM 10 MG PO TABS: 10.0000 mg | ORAL_TABLET | Freq: Every day | ORAL | 3 refills | Status: DC

## 2022-01-27 NOTE — Patient Instructions (Addendum)
Medication Instructions:  Your physician has recommended you make the following change in your medication:  1.) start aspirin 81 mg daily 2.) start atorvastatin 10 mg - one tablet daily  *If you need a refill on your cardiac medications before your next appointment, please call your pharmacy*   Lab Work: None today   Testing/Procedures: Your physician has requested that you have an echocardiogram. Echocardiography is a painless test that uses sound waves to create images of your heart. It provides your doctor with information about the size and shape of your heart and how well your heart's chambers and valves are working. This procedure takes approximately one hour. There are no restrictions for this procedure. Please do NOT wear cologne, perfume, aftershave, or lotions (deodorant is allowed). Please arrive 15 minutes prior to your appointment time.  Cardiac CTA - see instructions below   Follow-Up: At [facility], you and your health needs are our priority.  As part of our continuing mission to provide you with exceptional heart care, we have created designated Provider Care Teams.  These Care Teams include your primary Cardiologist (physician) and Advanced Practice Providers (APPs -  Physician Assistants and Nurse Practitioners) who all work together to provide you with the care you need, when you need it.   Your next appointment:   6 month(s)  The format for your next appointment:   In Person  Provider:   Early Osmond, MD     Other Instructions   Your cardiac CT will be scheduled at one of the below locations:   [facility] [address] [place], [place] [address] [phone]  Please arrive at the [facility] and Children's Entrance (Entrance C2) of [facility] 30 minutes prior to test start time. You can use the FREE valet parking offered at entrance C (encouraged to control the heart rate for the test)  Proceed to the [facility]  Radiology Department (first floor) to check-in and test prep.  All radiology patients and guests should use entrance C2 at [facility], accessed from [facility], even though the hospital's physical address listed is [address].     Please follow these instructions carefully (unless otherwise directed):  Hold all erectile dysfunction medications at least 3 days (72 hrs) prior to test. (Ie viagra, cialis, sildenafil, tadalafil, etc) We will administer nitroglycerin during this exam.   On the Night Before the Test: Be sure to Drink plenty of water. Do not consume any caffeinated/decaffeinated beverages or chocolate 12 hours prior to your test. Do not take any antihistamines 12 hours prior to your test.  On the Day of the Test: Drink plenty of water until 1 hour prior to the test. Do not eat any food 1 hour prior to test. You may take your regular medications prior to the test.  Take metoprolol (Lopressor) two hours prior to test. HOLD Hydrochlorothiazide morning of the test.  After the Test: Drink plenty of water. After receiving IV contrast, you may experience a mild flushed feeling. This is normal. On occasion, you may experience a mild rash up to 24 hours after the test. This is not dangerous. If this occurs, you can take Benadryl 25 mg and increase your fluid intake. If you experience trouble breathing, this can be serious. If it is severe call 911 IMMEDIATELY. If it is mild, please call our office. If you take any of these medications: Glipizide/Metformin, Avandament, Glucavance, please do not take 48 hours after  completing test unless otherwise instructed.  We will call to schedule your test 2-4 weeks out understanding that some insurance companies will need an authorization prior to the service being performed.   For non-scheduling related questions, please contact the cardiac imaging nurse navigator should you have any questions/concerns: Marchia Bond, Cardiac Imaging Nurse Navigator Gordy Clement, Cardiac Imaging Nurse Navigator [facility] Direct Office Dial: [phone]   For scheduling needs, including cancellations and rescheduling, please call [place], [phone].   Important Information About Sugar

## 2022-01-28 ENCOUNTER — Ambulatory Visit (INDEPENDENT_AMBULATORY_CARE_PROVIDER_SITE_OTHER): Payer: BC Managed Care – PPO

## 2022-01-28 DIAGNOSIS — I10 Essential (primary) hypertension: Secondary | ICD-10-CM

## 2022-01-28 DIAGNOSIS — Z01810 Encounter for preprocedural cardiovascular examination: Secondary | ICD-10-CM

## 2022-01-28 DIAGNOSIS — I2510 Atherosclerotic heart disease of native coronary artery without angina pectoris: Secondary | ICD-10-CM

## 2022-01-28 DIAGNOSIS — Z6839 Body mass index (BMI) 39.0-39.9, adult: Secondary | ICD-10-CM

## 2022-01-28 DIAGNOSIS — E785 Hyperlipidemia, unspecified: Secondary | ICD-10-CM

## 2022-01-28 DIAGNOSIS — R0600 Dyspnea, unspecified: Secondary | ICD-10-CM | POA: Diagnosis not present

## 2022-01-28 LAB — ECHOCARDIOGRAM COMPLETE
AR max vel: 3.44 cm2
AV Area VTI: 3.43 cm2
AV Area mean vel: 3.11 cm2
AV Mean grad: 5 mmHg
AV Peak grad: 9.1 mmHg
Ao pk vel: 1.51 m/s
Area-P 1/2: 3.66 cm2
S' Lateral: 2.45 cm

## 2022-01-28 MED ORDER — PERFLUTREN LIPID MICROSPHERE
1.0000 mL | INTRAVENOUS | Status: AC | PRN
  Administered 2022-01-28: 3 mL via INTRAVENOUS

## 2022-01-31 ENCOUNTER — Telehealth (HOSPITAL_COMMUNITY): Payer: Self-pay | Admitting: *Deleted

## 2022-01-31 NOTE — Telephone Encounter (Signed)
Reaching out to patient to offer assistance regarding upcoming cardiac imaging study; pt verbalizes understanding of appt date/time, parking situation and where to check in, pre-test NPO status and verified current allergies; name and call back number provided for further questions should they arise  Reginald Clement RN Navigator Cardiac Imaging [facility] [phone] office [phone] cell  Patient to take '100mg'$  metoprolol tartrate two hours prior to his cardiac CT scan.  He is aware to arrive at 2:30pm.

## 2022-02-01 ENCOUNTER — Ambulatory Visit (HOSPITAL_COMMUNITY)
Admission: RE | Admit: 2022-02-01 | Discharge: 2022-02-01 | Disposition: A | Discharge status: Home / Self Care | Payer: BC Managed Care – PPO | Source: Ambulatory Visit | Attending: Internal Medicine | Admitting: Internal Medicine

## 2022-02-01 DIAGNOSIS — R0600 Dyspnea, unspecified: Secondary | ICD-10-CM | POA: Insufficient documentation

## 2022-02-01 MED ORDER — IOHEXOL 350 MG/ML SOLN
115.0000 mL | Freq: Once | INTRAVENOUS | Status: AC | PRN
  Administered 2022-02-01: 115 mL via INTRAVENOUS

## 2022-02-01 MED ORDER — NITROGLYCERIN 0.4 MG SL SUBL
0.8000 mg | SUBLINGUAL_TABLET | Freq: Once | SUBLINGUAL | Status: AC
  Administered 2022-02-01: 0.8 mg via SUBLINGUAL

## 2022-02-01 MED ORDER — NITROGLYCERIN 0.4 MG SL SUBL
SUBLINGUAL_TABLET | SUBLINGUAL | Status: AC
  Filled 2022-02-01: qty 2

## 2022-02-07 ENCOUNTER — Ambulatory Visit: Payer: BC Managed Care – PPO | Admitting: Cardiology

## 2022-02-10 ENCOUNTER — Ambulatory Visit (HOSPITAL_COMMUNITY): Payer: BC Managed Care – PPO

## 2022-04-17 NOTE — Progress Notes (Unsigned)
Office Visit    Patient Name: Reginald Hill Date of Encounter: 04/18/2022  PCP:  Chesley Noon, MD   [place]  Cardiologist:  Early Osmond, MD  Advanced Practice Provider:  No care team member to display Electrophysiologist:  None   HPI    Reginald Hill is a 57 y.o. male with a past medical history significant for dyspnea, hypertension, obesity, coronary artery calcification seen on CAT scan, and hyperlipidemia presents today for follow-up appointment.  He was last seen by Dr. Ali Lowe and a coronary CTA and echocardiogram were ordered at that time to evaluate his dyspnea. His PCP has referred him back to Cardiology for palpitations.   Today, he tells me that he is having daily palpitations about 4-5 times a day and lasting 30 seconds to a minute. Likely experiencing bouts of SVT based on history. They occur when he is exerting himself. Recent echo and coronary CTA reviewed. He does endorse drinking a few caffeinated beverages throughout the day including diet mountain dew, celsius, and water flavor packets with added caffeine. We discussed limiting or avoiding caffeine and increasing hydration with water (can use flavor packs but without added caffeine). We discussed him wearing a monitor for the next week.  Reports no shortness of breath nor dyspnea on exertion. Reports no chest pain, pressure, or tightness. No edema, orthopnea, PND.     Past Medical History    Past Medical History:  Diagnosis Date   Dysplastic nevus 05/06/2021   right thigh- anterior  mild atypia   Dyspnea    Hypertension    Past Surgical History:  Procedure Laterality Date   KNEE SURGERY Right    SPINAL FUSION      Allergies  No Known Allergies   EKGs/Labs/Other Studies Reviewed:   The following studies were reviewed today:  Coronary CTA 02/01/22  IMPRESSION: 1. Coronary calcium score of 17.9. This was 32 percentile for age-, sex, and race-matched  controls.   2. Normal coronary origin with right dominance.   3. Minimal plaque in the LAD.   RECOMMENDATIONS: CAD-RADS 1: Minimal non-obstructive CAD (0-24%). Consider non-atherosclerotic causes of chest pain. Consider preventive therapy and risk factor modification.  Echo 01/28/2022  IMPRESSIONS     1. Left ventricular ejection fraction, by estimation, is 70 to 75%. The  left ventricle has hyperdynamic function. The left ventricle has no  regional wall motion abnormalities. Left ventricular diastolic parameters  were normal.   2. Right ventricular systolic function is normal. The right ventricular  size is normal.   3. The mitral valve is normal in structure. No evidence of mitral valve  regurgitation.   4. The aortic valve is normal in structure. Aortic valve regurgitation is  not visualized. No aortic stenosis is present.   5. The inferior vena cava is normal in size with greater than 50%  respiratory variability, suggesting right atrial pressure of 3 mmHg.   FINDINGS   Left Ventricle: Left ventricular ejection fraction, by estimation, is 70  to 75%. The left ventricle has hyperdynamic function. The left ventricle  has no regional wall motion abnormalities. Definity contrast agent was  given IV to delineate the left  ventricular endocardial borders. The left ventricular internal cavity size  was normal in size. There is no left ventricular hypertrophy. Left  ventricular diastolic parameters were normal.   Right Ventricle: The right ventricular size is normal. Right vetricular  wall thickness was not assessed. Right ventricular systolic function is  normal.   Left Atrium: Left atrial size was normal in size.   Right Atrium: Right atrial size was normal in size.   Pericardium: There is no evidence of pericardial effusion.   Mitral Valve: The mitral valve is normal in structure. No evidence of  mitral valve regurgitation.   Tricuspid Valve: The tricuspid valve is  normal in structure. Tricuspid  valve regurgitation is trivial.   Aortic Valve: The aortic valve is normal in structure. Aortic valve  regurgitation is not visualized. No aortic stenosis is present. Aortic  valve mean gradient measures 5.0 mmHg. Aortic valve peak gradient measures  9.1 mmHg. Aortic valve area, by VTI  measures 3.43 cm.   Pulmonic Valve: The pulmonic valve was not well visualized. Pulmonic valve  regurgitation is not visualized. No evidence of pulmonic stenosis.   Aorta: The aortic root is normal in size and structure.   Venous: The inferior vena cava is normal in size with greater than 50%  respiratory variability, suggesting right atrial pressure of 3 mmHg.   IAS/Shunts: No atrial level shunt detected by color flow Doppler.   EKG:  EKG is not ordered today.    Recent Labs: No results found for requested labs within last 365 days.  Recent Lipid Panel No results found for: "CHOL", "TRIG", "HDL", "CHOLHDL", "VLDL", "LDLCALC", "LDLDIRECT"   Home Medications   Current Meds  Medication Sig   amLODipine (NORVASC) 5 MG tablet Take 5 mg by mouth daily.   amoxicillin-clavulanate (AUGMENTIN) 875-125 MG tablet Take 1 tablet by mouth 2 (two) times daily. For ten days.   aspirin EC 81 MG tablet Take 1 tablet (81 mg total) by mouth daily. Swallow whole.   atorvastatin (LIPITOR) 10 MG tablet Take 1 tablet (10 mg total) by mouth daily.   esomeprazole (NEXIUM) 40 MG capsule Take 40 mg by mouth daily.   fluticasone (FLONASE) 50 MCG/ACT nasal spray Place 2 sprays into both nostrils daily.   hydrochlorothiazide (HYDRODIURIL) 25 MG tablet Take 25 mg by mouth daily.   methocarbamol (ROBAXIN) 500 MG tablet Take 500 mg by mouth 4 (four) times daily.   olmesartan (BENICAR) 40 MG tablet Take 40 mg by mouth daily.   oxyCODONE (OXY IR/ROXICODONE) 5 MG immediate release tablet Take 5 mg by mouth every 4 (four) hours as needed.   sertraline (ZOLOFT) 100 MG tablet Take 100 mg by mouth  daily.   sertraline (ZOLOFT) 100 MG tablet Take 50 mg by mouth daily. Pt takes 1/2 tablet 50 mg daily.   tadalafil (CIALIS) 20 MG tablet Take 20 mg by mouth daily as needed for erectile dysfunction.   Vitamin D, Ergocalciferol, (DRISDOL) 1.25 MG (50000 UNIT) CAPS capsule Take 50,000 Units by mouth once a week.   metoprolol tartrate (LOPRESSOR) 25 MG tablet Take 0.5 tablets (12.5 mg total) by mouth 2 (two) times daily.     Review of Systems      All other systems reviewed and are otherwise negative except as noted above.  Physical Exam    VS:  BP 122/84   Pulse 88   Ht '5\' 10"'$  (1.778 m)   Wt 278 lb 3.2 oz (126.2 kg)   SpO2 96%   BMI 39.92 kg/m  , BMI Body mass index is 39.92 kg/m.  Wt Readings from Last 3 Encounters:  04/18/22 278 lb 3.2 oz (126.2 kg)  01/27/22 281 lb 3.2 oz (127.6 kg)  09/23/20 267 lb (121.1 kg)     GEN: Well nourished, well developed, in no  acute distress. HEENT: normal. Neck: Supple, no JVD, carotid bruits, or masses. Cardiac: RRR, no murmurs, rubs, or gallops. No clubbing, cyanosis, edema.  Radials/PT 2+ and equal bilaterally.  Respiratory:  Respirations regular and unlabored, clear to auscultation bilaterally. GI: Soft, nontender, nondistended. MS: No deformity or atrophy. Skin: Warm and dry, no rash. Neuro:  Strength and sensation are intact. Psych: Normal affect.  Assessment & Plan    Palpitations -we will order some labs today: CBC, BMP, mag, and TSH -He drinks several caffeinated beverages a day including celsius, diet mountain dew, and flavor packs with added caffeine.  -limit or abstain from caffeine -increase hydration to 64 oz of water daily -7 day zio monitor -echo and CT results reviewed -add metoprolol 12.'5mg'$  BID  Hypertension -well controlled today -continue current medications  Obesity -increased exercise, right now limited by palpitations  Hyperlipidemia LDL goal less than 70 -lipid panel and LFTs at next  appointment -continue lipitor '10mg'$  daily  5. Non-obstructive CAD -continue asa '81mg'$ , lipitor '10mg'$  daily, norvasc '5mg'$  daily, Benicar '40mg'$  daily, add metoprolol 12.'5mg'$  BID (following monitor)       Disposition: Follow up 4 weeks  with Early Osmond, MD or APP.  Signed, Elgie Collard, PA-C 04/18/2022, 8:53 AM [place]

## 2022-04-18 ENCOUNTER — Ambulatory Visit (INDEPENDENT_AMBULATORY_CARE_PROVIDER_SITE_OTHER): Payer: BC Managed Care – PPO

## 2022-04-18 ENCOUNTER — Ambulatory Visit: Payer: BC Managed Care – PPO | Attending: Physician Assistant | Admitting: Physician Assistant

## 2022-04-18 ENCOUNTER — Encounter: Payer: Self-pay | Admitting: Physician Assistant

## 2022-04-18 VITALS — BP 122/84 | HR 88 | Ht 70.0 in | Wt 278.2 lb

## 2022-04-18 DIAGNOSIS — I2510 Atherosclerotic heart disease of native coronary artery without angina pectoris: Secondary | ICD-10-CM | POA: Diagnosis not present

## 2022-04-18 DIAGNOSIS — R002 Palpitations: Secondary | ICD-10-CM

## 2022-04-18 DIAGNOSIS — R0600 Dyspnea, unspecified: Secondary | ICD-10-CM

## 2022-04-18 DIAGNOSIS — Z6839 Body mass index (BMI) 39.0-39.9, adult: Secondary | ICD-10-CM | POA: Diagnosis not present

## 2022-04-18 DIAGNOSIS — I10 Essential (primary) hypertension: Secondary | ICD-10-CM | POA: Diagnosis not present

## 2022-04-18 DIAGNOSIS — E785 Hyperlipidemia, unspecified: Secondary | ICD-10-CM

## 2022-04-18 LAB — CBC

## 2022-04-18 MED ORDER — METOPROLOL TARTRATE 25 MG PO TABS: 12.5000 mg | ORAL_TABLET | Freq: Two times a day (BID) | ORAL | 3 refills | Status: DC

## 2022-04-18 NOTE — Patient Instructions (Addendum)
Medication Instructions:  1.Start metoprolol tartrate 12.5 mg twice a day, this will be 1/2 of a 25 mg tablet twice a day. Start taking this after you finish wearing the zio monitor. *If you need a refill on your cardiac medications before your next appointment, please call your pharmacy*   Lab Work: TSH, BMP, CBC, and Mag today If you have labs (blood work) drawn today and your tests are completely normal, you will receive your results only by: [place] (if you have MyChart) OR A paper copy in the mail If you have any lab test that is abnormal or we need to change your treatment, we will call you to review the results.   Testing/Procedures: [place] [place]- Long Term Monitor Instructions  Your physician has requested you wear a ZIO patch monitor for 7 days.  This is a single patch monitor. [employer] supplies one patch monitor per enrollment. Additional stickers are not available. Please do not apply patch if you will be having a Nuclear Stress Test,  Echocardiogram, Cardiac CT, MRI, or Chest Xray during the period you would be wearing the  monitor. The patch cannot be worn during these tests. You cannot remove and re-apply the  ZIO XT patch monitor.  Your ZIO patch monitor will be mailed 3 day USPS to your address on file. It may take 3-5 days  to receive your monitor after you have been enrolled.  Once you have received your monitor, please review the enclosed instructions. Your monitor  has already been registered assigning a specific monitor serial # to you.  Billing and Patient Assistance Program Information  We have supplied [employer] with any of your insurance information on file for billing purposes. [employer] offers a sliding scale Patient Assistance Program for patients that do not have  insurance, or whose insurance does not completely cover the cost of the ZIO monitor.  You must apply for the Patient Assistance Program to qualify for this discounted rate.  To apply, please call  [employer] at [phone], select option 4, select option 2, ask to apply for  Patient Assistance Program. Theodore Demark will ask your household income, and how many people  are in your household. They will quote your out-of-pocket cost based on that information.  [employer] will also be able to set up a 25-month interest-free payment plan if needed.  Applying the monitor   Shave hair from upper left chest.  Hold abrader disc by orange tab. Rub abrader in 40 strokes over the upper left chest as  indicated in your monitor instructions.  Clean area with 4 enclosed alcohol pads. Let dry.  Apply patch as indicated in monitor instructions. Patch will be placed under collarbone on left  side of chest with arrow pointing upward.  Rub patch adhesive wings for 2 minutes. Remove white label marked "1". Remove the white  label marked "2". Rub patch adhesive wings for 2 additional minutes.  While looking in a mirror, press and release button in center of patch. A small green light will  flash 3-4 times. This will be your only indicator that the monitor has been turned on.  Do not shower for the first 24 hours. You may shower after the first 24 hours.  Press the button if you feel a symptom. You will hear a small click. Record Date, Time and  Symptom in the Patient Logbook.  When you are ready to remove the patch, follow instructions on the last 2 pages of Patient  Logbook. Stick patch monitor onto  the last page of Patient Logbook.  Place Patient Logbook in the blue and white box. Use locking tab on box and tape box closed  securely. The blue and white box has prepaid postage on it. Please place it in the mailbox as  soon as possible. Your physician should have your test results approximately 7 days after the  monitor has been mailed back to [facility].  Call [place] at [phone] if you have questions regarding  your ZIO XT patch monitor. Call them immediately if you see an orange  light blinking on your  monitor.  If your monitor falls off in less than 4 days, contact our Monitor department at [phone].  If your monitor becomes loose or falls off after 4 days call [employer] at [phone] for  suggestions on securing your monitor    Follow-Up: At [facility], you and your health needs are our priority.  As part of our continuing mission to provide you with exceptional heart care, we have created designated Provider Care Teams.  These Care Teams include your primary Cardiologist (physician) and Advanced Practice Providers (APPs -  Physician Assistants and Nurse Practitioners) who all work together to provide you with the care you need, when you need it.   Your next appointment:   1 month(s)  Provider:   Early Osmond, MD  or Nicholes Rough, PA-C       Other Instructions Limit/eliminate all caffeine and increase hydration to 64 oz of water daily

## 2022-04-18 NOTE — Progress Notes (Unsigned)
[id number] from office inventory applied to patient. Dr. Ali Lowe to read.

## 2022-04-19 LAB — CBC
Hematocrit: 39.7 % (ref 37.5–51.0)
Hemoglobin: 12.9 g/dL — ABNORMAL LOW (ref 13.0–17.7)
MCH: 26.9 pg (ref 26.6–33.0)
MCHC: 32.5 g/dL (ref 31.5–35.7)
MCV: 83 fL (ref 79–97)
Platelets: 224 10*3/uL (ref 150–450)
RBC: 4.8 x10E6/uL (ref 4.14–5.80)
RDW: 13.6 % (ref 11.6–15.4)
WBC: 6.5 10*3/uL (ref 3.4–10.8)

## 2022-04-19 LAB — BASIC METABOLIC PANEL
BUN/Creatinine Ratio: 20 (ref 9–20)
BUN: 18 mg/dL (ref 6–24)
CO2: 27 mmol/L (ref 20–29)
Calcium: 9.7 mg/dL (ref 8.7–10.2)
Chloride: 102 mmol/L (ref 96–106)
Creatinine, Ser: 0.92 mg/dL (ref 0.76–1.27)
Glucose: 118 mg/dL — ABNORMAL HIGH (ref 70–99)
Potassium: 4.6 mmol/L (ref 3.5–5.2)
Sodium: 142 mmol/L (ref 134–144)
eGFR: 96 mL/min/{1.73_m2} (ref >59)

## 2022-04-19 LAB — MAGNESIUM: Magnesium: 2 mg/dL (ref 1.6–2.3)

## 2022-04-19 LAB — TSH: TSH: 0.199 u[IU]/mL — ABNORMAL LOW (ref 0.450–4.500)

## 2022-04-20 ENCOUNTER — Other Ambulatory Visit: Payer: Self-pay | Admitting: Internal Medicine

## 2022-05-09 ENCOUNTER — Ambulatory Visit: Payer: BC Managed Care – PPO | Admitting: Dermatology

## 2022-05-12 ENCOUNTER — Ambulatory Visit: Payer: BC Managed Care – PPO | Admitting: Dermatology

## 2022-05-30 ENCOUNTER — Ambulatory Visit: Payer: BC Managed Care – PPO | Admitting: Dermatology

## 2022-05-30 ENCOUNTER — Ambulatory Visit: Payer: BC Managed Care – PPO | Attending: Internal Medicine | Admitting: Internal Medicine

## 2022-05-30 ENCOUNTER — Encounter: Payer: Self-pay | Admitting: Internal Medicine

## 2022-05-30 VITALS — BP 122/70 | HR 78 | Ht 70.0 in | Wt 280.0 lb

## 2022-05-30 DIAGNOSIS — R0609 Other forms of dyspnea: Secondary | ICD-10-CM

## 2022-05-30 DIAGNOSIS — I10 Essential (primary) hypertension: Secondary | ICD-10-CM

## 2022-05-30 DIAGNOSIS — I2510 Atherosclerotic heart disease of native coronary artery without angina pectoris: Secondary | ICD-10-CM | POA: Diagnosis not present

## 2022-05-30 DIAGNOSIS — R002 Palpitations: Secondary | ICD-10-CM

## 2022-05-30 DIAGNOSIS — Z6839 Body mass index (BMI) 39.0-39.9, adult: Secondary | ICD-10-CM

## 2022-05-30 DIAGNOSIS — E785 Hyperlipidemia, unspecified: Secondary | ICD-10-CM

## 2022-05-30 NOTE — Progress Notes (Signed)
Cardiology Office Note:    Date:  05/30/2022   ID:  Reginald Hill, DOB [birth year], MRN [id number]  PCP:  Chesley Noon, MD   [place] Providers Cardiologist:  Lenna Sciara, MD Referring MD: Chesley Noon, MD   Chief Complaint/Reason for Referral: Cardiology follow-up ASSESSMENT:    1. Dyspnea on exertion   2. Mild CAD   3. Primary hypertension   4. BMI 39.0-39.9,adult   5. Hyperlipidemia LDL goal <70   6. Palpitations      PLAN:    In order of problems listed above: 1.  Dyspnea: This is relatively unchanged and likely multifactorial with contributions from body habitus and obstructive sleep apnea. 2.  Mild coronary artery disease: Continue aspirin and statin. 3.  Hypertension: Blood pressure is well-controlled on his current regimen. 4.  Elevated BMI: PCP is trying to get [facility] for the patient. 5.  Hyperlipidemia: Check lipid panel, LFTs, and LP(a) today. 6.  Palpitations: Likely the patient's palpitations may be due to untreated sleep apnea in addition to an abnormal thyroid function test.  He will be seeing endocrinology in the next few months.  I believe his metoprolol is likely exacerbating his erectile dysfunction p.o.  Will stop this and have asked him to use his CPAP continuously for the next few weeks.  If he continues to have palpitations we will start a calcium channel blocker and I will refer him to EP for further recommendations.           Dispo:  Return in about 6 months (around 11/28/2022).      Medication Adjustments/Labs and Tests Ordered: Current medicines are reviewed at length with the patient today.  Concerns regarding medicines are outlined above.  The following changes have been made:     Labs/tests ordered: Orders Placed This Encounter  Procedures   Lipid panel   Hepatic function panel   Lipoprotein A (LPA)    Medication Changes: No orders of the defined types were placed in this encounter.    Current medicines are  reviewed at length with the patient today.  The patient does not have concerns regarding medicines.   History of Present Illness:    FOCUSED PROBLEM LIST:   Hypertension BMI of 39 Minimal plaquing and coronary calcification on coronary CTA 2023 Hyperlipidemia OSA, not entirely compliant with CPAP   October 2023 consultation: The patient is a 57 y.o. male with the indicated medical history here for recommendations regarding dyspnea.  Additionally the patient is to undergo orthopedic surgery under general anesthesia on his right foot.  Plan: Obtain coronary CTA and echocardiogram, start aspirin and atorvastatin.  Today: In the interim the patient had a reassuring coronary CTA which showed only mild obstructive coronary artery disease and a reassuring echocardiogram.  He was seen in our clinic due to palpitations and monitor showed symptomatic SVT and PACs.  He was started on metoprolol 12.5 mg twice daily.  His TSH was found to be low and this result was passed on to his primary care provider's office.  He has noticed that the metoprolol has helped his palpitations but he still gets them especially when he exerts himself.  Of note he was diagnosed with severe obstructive sleep apnea but has not been entirely compliant with his CPAP.  He does have issues with erectile dysfunction for a long time and has been taking Cialis but has noticed recently that it seems to be a little bit worse.  He denies any exertional angina.  He does have chronic dyspnea that is not changed.  His PCP is trying to get him Wegovy due to his BMI.  He fortunately is not required any emergency room visits or hospitalizations.          Current Medications: Current Meds  Medication Sig   amLODipine (NORVASC) 5 MG tablet Take 5 mg by mouth daily.   aspirin EC 81 MG tablet Take 1 tablet (81 mg total) by mouth daily. Swallow whole.   atorvastatin (LIPITOR) 10 MG tablet Take 1 tablet (10 mg total) by mouth daily.    esomeprazole (NEXIUM) 40 MG capsule Take 40 mg by mouth daily.   fluticasone (FLONASE) 50 MCG/ACT nasal spray Place 2 sprays into both nostrils daily.   hydrochlorothiazide (HYDRODIURIL) 25 MG tablet Take 1 tablet (25 mg total) by mouth daily.   olmesartan (BENICAR) 40 MG tablet Take 40 mg by mouth daily.   oxyCODONE (OXY IR/ROXICODONE) 5 MG immediate release tablet Take 5 mg by mouth every 4 (four) hours as needed.   sertraline (ZOLOFT) 100 MG tablet Take 50 mg by mouth daily. Pt takes 1/2 tablet 50 mg daily.   tadalafil (CIALIS) 20 MG tablet Take 20 mg by mouth daily as needed for erectile dysfunction.   Vitamin D, Ergocalciferol, (DRISDOL) 1.25 MG (50000 UNIT) CAPS capsule Take 50,000 Units by mouth once a week.   [DISCONTINUED] metoprolol tartrate (LOPRESSOR) 25 MG tablet Take 0.5 tablets (12.5 mg total) by mouth 2 (two) times daily.     Allergies:    Patient has no known allergies.   Social History:   Social History   Tobacco Use   Smoking status: Never   Smokeless tobacco: Never  Vaping Use   Vaping Use: Never used  Substance Use Topics   Alcohol use: Yes    Comment: Social   Drug use: Never     Family Hx: Family History  Problem Relation Age of Onset   Heart attack Father      Review of Systems:   Please see the history of present illness.    All other systems reviewed and are negative.     EKGs/Labs/Other Test Reviewed:    EKG:  EKG performed today that I personally reviewed demonstrates normal sinus rhythm with possible left atrial enlargement with isolated q wave in lead III.  Prior CV studies:  Monitor 2023: 10 Supraventricular Tachycardia runs occurred, the run with the fastest interval lasting 10.1 secs with a max rate of 197 bpm, the longest lasting 13.0 secs with an avg rate of 149 bpm. Supraventricular Tachycardia was detected within +/- 45 seconds of symptomatic patient event(s).    Isolated SVEs were rare (<1.0%), SVE Couplets were rare (<1.0%), and  SVE Triplets were rare (<1.0%).    Isolated VEs were rare (<1.0%, 207), VE Couplets were rare (<1.0%, 1), and VE Triplets were rare (<1.0%, 2).   No atrial fibrillation, sustained ventricular tachyarrhythmias, or bradyarrhythmias were detected.   Patient triggered events corresponded with SVT, PACS and sinus rhythm.  TTE 2023:  1. Left ventricular ejection fraction, by estimation, is 70 to 75%. The  left ventricle has hyperdynamic function. The left ventricle has no  regional wall motion abnormalities. Left ventricular diastolic parameters  were normal.   2. Right ventricular systolic function is normal. The right ventricular  size is normal.   3. The mitral valve is normal in structure. No evidence of mitral valve  regurgitation.   4. The aortic valve is normal in structure. Aortic  valve regurgitation is  not visualized. No aortic stenosis is present.   5. The inferior vena cava is normal in size with greater than 50%  respiratory variability, suggesting right atrial pressure of 3 mmHg.   Coronary CTA 2023: 1. Coronary calcium score of 17.9. This was 23 percentile for age-, sex, and race-matched controls. 2. Normal coronary origin with right dominance. 3. Minimal plaque in the LAD.  Calcium score CT 2023 (CareEverywhere): IMPRESSION: The total coronary calcium score is 30 which is between the 25th and 50th percentile for a male patient this age.   Other studies Reviewed: Review of the additional studies/records demonstrates: No imaging available that demonstrates aortic atherosclerosis or coronary artery calcification  Recent Labs: 04/18/2022: BUN 18; Creatinine, Ser 0.92; Hemoglobin 12.9; Magnesium 2.0; Platelets 224; Potassium 4.6; Sodium 142; TSH 0.199   Recent Lipid Panel No results found for: "CHOL", "TRIG", "HDL", "LDLCALC", "LDLDIRECT"  Labs in care everywhere demonstrate an LDL of 55.  Risk Assessment/Calculations:                Physical Exam:    VS:  BP  122/70   Pulse 78   Ht '5\' 10"'$  (1.778 m)   Wt 280 lb (127 kg)   SpO2 95%   BMI 40.18 kg/m    Wt Readings from Last 3 Encounters:  05/30/22 280 lb (127 kg)  04/18/22 278 lb 3.2 oz (126.2 kg)  01/27/22 281 lb 3.2 oz (127.6 kg)    GENERAL:  No apparent distress, AOx3 HEENT:  No carotid bruits, +2 carotid impulses, no scleral icterus CAR: RRR no murmurs, gallops, rubs, or thrills RES:  Clear to auscultation bilaterally ABD:  Soft, nontender, nondistended, positive bowel sounds x 4 VASC:  +2 radial pulses, +2 carotid pulses, palpable pedal pulses NEURO:  CN 2-12 grossly intact; motor and sensory grossly intact PSYCH:  No active depression or anxiety EXT:  No edema, ecchymosis, or cyanosis  Signed, Early Osmond, MD  05/30/2022 3:20 PM    [address], [place], [place]  [address] Phone: [phone]; Fax: [phone]   Note:  This document was prepared using Dragon voice recognition software and may include unintentional dictation errors.

## 2022-05-30 NOTE — Patient Instructions (Addendum)
Medication Instructions:  Your physician has recommended you make the following change in your medication:  1-STOP metoprolol  *If you need a refill on your cardiac medications before your next appointment, please call your pharmacy*  Lab Work: Your physician recommends that you have lab work today- Lipid panel, lipoprotein L(a), LFT  If you have labs (blood work) drawn today and your tests are completely normal, you will receive your results only by: [place] (if you have MyChart) OR A paper copy in the mail If you have any lab test that is abnormal or we need to change your treatment, we will call you to review the results.  Testing/Procedures: None ordered today.  Follow-Up: At [facility], you and your health needs are our priority.  As part of our continuing mission to provide you with exceptional heart care, we have created designated Provider Care Teams.  These Care Teams include your primary Cardiologist (physician) and Advanced Practice Providers (APPs -  Physician Assistants and Nurse Practitioners) who all work together to provide you with the care you need, when you need it.  We recommend signing up for the patient portal called "MyChart".  Sign up information is provided on this After Visit Summary.  MyChart is used to connect with patients for Virtual Visits (Telemedicine).  Patients are able to view lab/test results, encounter notes, upcoming appointments, etc.  Non-urgent messages can be sent to your provider as well.   To learn more about what you can do with MyChart, go to [email or web address].    Your next appointment:   6 month(s)  Provider:   PA or NP

## 2022-05-31 LAB — HEPATIC FUNCTION PANEL
ALT: 20 IU/L (ref 0–44)
AST: 19 IU/L (ref 0–40)
Albumin: 4.3 g/dL (ref 3.8–4.9)
Alkaline Phosphatase: 82 IU/L (ref 44–121)
Bilirubin Total: 0.6 mg/dL (ref 0.0–1.2)
Bilirubin, Direct: 0.16 mg/dL (ref 0.00–0.40)
Total Protein: 6.8 g/dL (ref 6.0–8.5)

## 2022-05-31 LAB — LIPID PANEL
Chol/HDL Ratio: 3 ratio (ref 0.0–5.0)
Cholesterol, Total: 121 mg/dL (ref 100–199)
HDL: 41 mg/dL (ref >39)
LDL Chol Calc (NIH): 51 mg/dL (ref 0–99)
Triglycerides: 176 mg/dL — ABNORMAL HIGH (ref 0–149)
VLDL Cholesterol Cal: 29 mg/dL (ref 5–40)

## 2022-05-31 LAB — LIPOPROTEIN A (LPA): Lipoprotein (a): <8.4 nmol/L (ref <75.0)

## 2022-07-10 ENCOUNTER — Other Ambulatory Visit: Payer: Self-pay | Admitting: Internal Medicine

## 2022-07-10 DIAGNOSIS — I10 Essential (primary) hypertension: Secondary | ICD-10-CM

## 2022-07-11 ENCOUNTER — Encounter: Payer: Self-pay | Admitting: Dermatology

## 2022-07-11 ENCOUNTER — Ambulatory Visit: Payer: BC Managed Care – PPO | Admitting: Dermatology

## 2022-07-11 VITALS — BP 122/70

## 2022-07-11 DIAGNOSIS — D1801 Hemangioma of skin and subcutaneous tissue: Secondary | ICD-10-CM

## 2022-07-11 DIAGNOSIS — D229 Melanocytic nevi, unspecified: Secondary | ICD-10-CM | POA: Diagnosis not present

## 2022-07-11 DIAGNOSIS — L810 Postinflammatory hyperpigmentation: Secondary | ICD-10-CM

## 2022-07-11 DIAGNOSIS — L814 Other melanin hyperpigmentation: Secondary | ICD-10-CM | POA: Diagnosis not present

## 2022-07-11 DIAGNOSIS — L821 Other seborrheic keratosis: Secondary | ICD-10-CM

## 2022-07-11 DIAGNOSIS — Z1283 Encounter for screening for malignant neoplasm of skin: Secondary | ICD-10-CM | POA: Diagnosis not present

## 2022-07-11 DIAGNOSIS — L578 Other skin changes due to chronic exposure to nonionizing radiation: Secondary | ICD-10-CM

## 2022-07-11 DIAGNOSIS — Z86018 Personal history of other benign neoplasm: Secondary | ICD-10-CM

## 2022-07-11 NOTE — Patient Instructions (Signed)
Melanoma ABCDEs  Melanoma is the most dangerous type of skin cancer, and is the leading cause of death from skin disease.  You are more likely to develop melanoma if you: Have light-colored skin, light-colored eyes, or red or blond hair Spend a lot of time in the sun Tan regularly, either outdoors or in a tanning bed Have had blistering sunburns, especially during childhood Have a close family member who has had a melanoma Have atypical moles or large birthmarks  Early detection of melanoma is key since treatment is typically straightforward and cure rates are extremely high if we catch it early.   The first sign of melanoma is often a change in a mole or a new dark spot.  The ABCDE system is a way of remembering the signs of melanoma.  A for asymmetry:  The two halves do not match. B for border:  The edges of the growth are irregular. C for color:  A mixture of colors are present instead of an even brown color. D for diameter:  Melanomas are usually (but not always) greater than 6mm - the size of a pencil eraser. E for evolution:  The spot keeps changing in size, shape, and color.  Please check your skin once per month between visits. You can use a small mirror in front and a large mirror behind you to keep an eye on the back side or your body.   If you see any new or changing lesions before your next follow-up, please call to schedule a visit.  Please continue daily skin protection including broad spectrum sunscreen SPF 30+ to sun-exposed areas, reapplying every 2 hours as needed when you're outdoors.    Due to recent changes in healthcare laws, you may see results of your pathology and/or laboratory studies on MyChart before the doctors have had a chance to review them. We understand that in some cases there may be results that are confusing or concerning to you. Please understand that not all results are received at the same time and often the doctors may need to interpret multiple  results in order to provide you with the best plan of care or course of treatment. Therefore, we ask that you please give us 2 business days to thoroughly review all your results before contacting the office for clarification. Should we see a critical lab result, you will be contacted sooner.   If You Need Anything After Your Visit  If you have any questions or concerns for your doctor, please call our main line at [phone] and press option 4 to reach your doctor's medical assistant. If no one answers, please leave a voicemail as directed and we will return your call as soon as possible. Messages left after 4 pm will be answered the following business day.   You may also send us a message via MyChart. We typically respond to MyChart messages within 1-2 business days.  For prescription refills, please ask your pharmacy to contact our office. Our fax number is [phone].  If you have an urgent issue when the clinic is closed that cannot wait until the next business day, you can page your doctor at the number below.    Please note that while we do our best to be available for urgent issues outside of office hours, we are not available 24/7.   If you have an urgent issue and are unable to reach us, you may choose to seek medical care at your doctor's office, retail clinic, urgent care   center, or emergency room.  If you have a medical emergency, please immediately call 911 or go to the emergency department.  Pager Numbers  - Dr. Kowalski: [phone]  - Dr. Moye: [phone]  - Dr. Stewart: [phone]  In the event of inclement weather, please call our main line at [phone] for an update on the status of any delays or closures.  Dermatology Medication Tips: Please keep the boxes that topical medications come in in order to help keep track of the instructions about where and how to use these. Pharmacies typically print the medication instructions only on the boxes and not  directly on the medication tubes.   If your medication is too expensive, please contact our office at [phone] option 4 or send us a message through MyChart.   We are unable to tell what your co-pay for medications will be in advance as this is different depending on your insurance coverage. However, we may be able to find a substitute medication at lower cost or fill out paperwork to get insurance to cover a needed medication.   If a prior authorization is required to get your medication covered by your insurance company, please allow us 1-2 business days to complete this process.  Drug prices often vary depending on where the prescription is filled and some pharmacies may offer cheaper prices.  The website www.goodrx.com contains coupons for medications through different pharmacies. The prices here do not account for what the cost may be with help from insurance (it may be cheaper with your insurance), but the website can give you the price if you did not use any insurance.  - You can print the associated coupon and take it with your prescription to the pharmacy.  - You may also stop by our office during regular business hours and pick up a GoodRx coupon card.  - If you need your prescription sent electronically to a different pharmacy, notify our office through [facility] MyChart or by phone at [phone] option 4.     Si Usted Necesita Algo Despus de Su Visita  Tambin puede enviarnos un mensaje a travs de MyChart. Por lo general respondemos a los mensajes de MyChart en el transcurso de 1 a 2 das hbiles.  Para renovar recetas, por favor pida a su farmacia que se ponga en contacto con nuestra oficina. Nuestro nmero de fax es el [phone].  Si tiene un asunto urgente cuando la clnica est cerrada y que no puede esperar hasta el siguiente da hbil, puede llamar/localizar a su doctor(a) al nmero que aparece a continuacin.   Por favor, tenga en cuenta que aunque hacemos  todo lo posible para estar disponibles para asuntos urgentes fuera del horario de oficina, no estamos disponibles las 24 horas del da, los 7 das de la semana.   Si tiene un problema urgente y no puede comunicarse con nosotros, puede optar por buscar atencin mdica  en el consultorio de su doctor(a), en una clnica privada, en un centro de atencin urgente o en una sala de emergencias.  Si tiene una emergencia mdica, por favor llame inmediatamente al 911 o vaya a la sala de emergencias.  Nmeros de bper  - Dr. Kowalski: [phone]  - Dra. Moye: [phone]  - Dra. Stewart: [phone]  En caso de inclemencias del tiempo, por favor llame a nuestra lnea principal al [phone] para una actualizacin sobre el estado de cualquier retraso o cierre.  Consejos para la medicacin en dermatologa: Por favor, guarde las cajas en las   que vienen los medicamentos de uso tpico para ayudarle a seguir las instrucciones sobre dnde y cmo usarlos. Las farmacias generalmente imprimen las instrucciones del medicamento slo en las cajas y no directamente en los tubos del medicamento.   Si su medicamento es muy caro, por favor, pngase en contacto con nuestra oficina llamando al [phone] y presione la opcin 4 o envenos un mensaje a travs de MyChart.   No podemos decirle cul ser su copago por los medicamentos por adelantado ya que esto es diferente dependiendo de la cobertura de su seguro. Sin embargo, es posible que podamos encontrar un medicamento sustituto a menor costo o llenar un formulario para que el seguro cubra el medicamento que se considera necesario.   Si se requiere una autorizacin previa para que su compaa de seguros cubra su medicamento, por favor permtanos de 1 a 2 das hbiles para completar este proceso.  Los precios de los medicamentos varan con frecuencia dependiendo del lugar de dnde se surte la receta y alguna farmacias pueden ofrecer precios ms baratos.  El  sitio web www.goodrx.com tiene cupones para medicamentos de diferentes farmacias. Los precios aqu no tienen en cuenta lo que podra costar con la ayuda del seguro (puede ser ms barato con su seguro), pero el sitio web puede darle el precio si no utiliz ningn seguro.  - Puede imprimir el cupn correspondiente y llevarlo con su receta a la farmacia.  - Tambin puede pasar por nuestra oficina durante el horario de atencin regular y recoger una tarjeta de cupones de GoodRx.  - Si necesita que su receta se enve electrnicamente a una farmacia diferente, informe a nuestra oficina a travs de MyChart de [facility] o por telfono llamando al [phone] y presione la opcin 4.  

## 2022-07-11 NOTE — Progress Notes (Signed)
   Follow-Up Visit   Subjective  Reginald Hill is a 57 y.o. male who presents for the following: Skin Cancer Screening and Full Body Skin Exam  The patient presents for Total-Body Skin Exam (TBSE) for skin cancer screening and mole check. The patient has spots, moles and lesions to be evaluated, some may be new or changing and the patient has concerns that these could be cancer.  Hx of dysplastic nevus at right anterior thigh.   The following portions of the chart were reviewed this encounter and updated as appropriate: medications, allergies, medical history  Review of Systems:  No other skin or systemic complaints except as noted in HPI or Assessment and Plan.  Objective  Well appearing patient in no apparent distress; mood and affect are within normal limits.  A full examination was performed including scalp, head, eyes, ears, nose, lips, neck, chest, axillae, abdomen, back, buttocks, bilateral upper extremities, bilateral lower extremities, hands, feet, fingers, toes, fingernails, and toenails. All findings within normal limits unless otherwise noted below.   Relevant physical exam findings are noted in the Assessment and Plan.  Left Flank Linear     Assessment & Plan   LENTIGINES, SEBORRHEIC KERATOSES, HEMANGIOMAS - Benign normal skin lesions - Benign-appearing - Call for any changes  MELANOCYTIC NEVI - Tan-brown and/or pink-flesh-colored symmetric macules and papules - Benign appearing on exam today - Observation - Call clinic for new or changing moles - Recommend daily use of broad spectrum spf 30+ sunscreen to sun-exposed areas.   ACTINIC DAMAGE - Chronic condition, secondary to cumulative UV/sun exposure - diffuse scaly erythematous macules with underlying dyspigmentation - Recommend daily broad spectrum sunscreen SPF 30+ to sun-exposed areas, reapply every 2 hours as needed.  - Staying in the shade or wearing long sleeves, sun glasses (UVA+UVB protection) and  wide brim hats (4-inch brim around the entire circumference of the hat) are also recommended for sun protection.  - Call for new or changing lesions.  SKIN CANCER SCREENING PERFORMED TODAY.  History of Dysplastic Nevi - No evidence of recurrence today at right anterior thigh, mild atypia 2023 - Recommend regular full body skin exams - Recommend daily broad spectrum sunscreen SPF 30+ to sun-exposed areas, reapply every 2 hours as needed.  - Call if any new or changing lesions are noted between office visits  Nevus Spilus - Brown macules or papules within lighter tan patch at right side - Genetic - Benign, observe - Call for any changes  Post-inflammatory hyperpigmentation Left Flank  Patient advised should improve over time.   2ndary to trauma   Return for TBSE, Hx Dysplastic Nevi 1-2 years.  Anise Salvo, RMA, am acting as scribe for Armida Sans, MD .   Documentation: I have reviewed the above documentation for accuracy and completeness, and I agree with the above.  Armida Sans, MD

## 2022-07-20 ENCOUNTER — Encounter: Payer: Self-pay | Admitting: Dermatology

## 2022-08-02 ENCOUNTER — Other Ambulatory Visit: Payer: Self-pay | Admitting: Neurosurgery

## 2022-08-02 DIAGNOSIS — M4802 Spinal stenosis, cervical region: Secondary | ICD-10-CM

## 2022-08-11 ENCOUNTER — Ambulatory Visit
Admission: RE | Admit: 2022-08-11 | Discharge: 2022-08-11 | Disposition: A | Discharge status: Home / Self Care | Payer: BC Managed Care – PPO | Source: Ambulatory Visit | Attending: Neurosurgery | Admitting: Neurosurgery

## 2022-08-11 DIAGNOSIS — M4802 Spinal stenosis, cervical region: Secondary | ICD-10-CM

## 2022-10-07 ENCOUNTER — Telehealth: Payer: Self-pay | Admitting: Internal Medicine

## 2022-10-07 NOTE — Telephone Encounter (Signed)
STAT if patient feels like he/she is going to faint   Are you dizzy now? no  Do you feel faint or have you passed out? States he hasn't passed out, but yesterday he felt real lightheaded.   Do you have any other symptoms? Has been having SOB, has been having the SOB for 2-3 months when he is active, having palpitations, having hand and leg numbness.   Have you checked your HR and BP (record if available)? States he has checked his BP and it's been normal, but hasn't checked it lately.  Will check so he has one when nurse calls.

## 2022-10-07 NOTE — Telephone Encounter (Signed)
Called pt in regards to symptoms. [place] became [place]zy, lightheaded and felt that would pass out.  Felt that heart was beating faster than normal.  At time of occurrence pt had not eaten was getting ready for lunch.  Had some beers and some water.  Advised that the heat, beers and not eating all could have contributed to symptoms.  Does not have a recent BP reading.   Reports once back in cool air and took it easy felt better.  Had sleep test does not use CPAP as should.  Reports just can't tolerate it.  Advised pt that untreated sleep apnea can have negative effects on the heart.  Pt expresses is aware.  Would like to be evaluated for symptoms.   Scheduled OV with Asa Lente, PA for 10/17/22 at 2:45 advised ED visit for urgent needs.  No further concerns at this time.

## 2022-10-17 ENCOUNTER — Encounter: Payer: Self-pay | Admitting: Physician Assistant

## 2022-10-17 ENCOUNTER — Ambulatory Visit: Payer: BC Managed Care – PPO | Attending: Physician Assistant | Admitting: Physician Assistant

## 2022-10-17 VITALS — BP 110/70 | HR 90 | Ht 70.0 in | Wt 290.2 lb

## 2022-10-17 DIAGNOSIS — Z6839 Body mass index (BMI) 39.0-39.9, adult: Secondary | ICD-10-CM | POA: Diagnosis not present

## 2022-10-17 DIAGNOSIS — I2510 Atherosclerotic heart disease of native coronary artery without angina pectoris: Secondary | ICD-10-CM | POA: Diagnosis not present

## 2022-10-17 DIAGNOSIS — I10 Essential (primary) hypertension: Secondary | ICD-10-CM

## 2022-10-17 DIAGNOSIS — E785 Hyperlipidemia, unspecified: Secondary | ICD-10-CM

## 2022-10-17 DIAGNOSIS — I739 Peripheral vascular disease, unspecified: Secondary | ICD-10-CM

## 2022-10-17 DIAGNOSIS — R0609 Other forms of dyspnea: Secondary | ICD-10-CM

## 2022-10-17 DIAGNOSIS — R55 Syncope and collapse: Secondary | ICD-10-CM

## 2022-10-17 DIAGNOSIS — R002 Palpitations: Secondary | ICD-10-CM

## 2022-10-17 DIAGNOSIS — R42 Dizziness and giddiness: Secondary | ICD-10-CM

## 2022-10-17 DIAGNOSIS — R0600 Dyspnea, unspecified: Secondary | ICD-10-CM

## 2022-10-17 DIAGNOSIS — Z01810 Encounter for preprocedural cardiovascular examination: Secondary | ICD-10-CM

## 2022-10-17 MED ORDER — CARVEDILOL 3.125 MG PO TABS: 3.1250 mg | ORAL_TABLET | Freq: Two times a day (BID) | ORAL | 3 refills | Status: DC

## 2022-10-17 NOTE — Progress Notes (Signed)
Office Visit    Patient Name: Reginald Hill Date of Encounter: 10/17/2022  PCP:  Eartha Inch, MD   [facility]  Cardiologist:  Orbie Pyo, MD  Advanced Practice Provider:  No care team member to display Electrophysiologist:  None   HPI    Reginald Hill is a 57 y.o. male with a past medical history significant for dyspnea, hypertension, obesity, coronary artery calcification seen on CAT scan, and hyperlipidemia presents today for follow-up appointment.  He was last seen by Reginald Hill and a coronary CTA and echocardiogram were ordered at that time to evaluate his dyspnea. His PCP has referred him back to Cardiology for palpitations.   He was seen by me January 2024, he tells me that he is having daily palpitations about 4-5 times a day and lasting 30 seconds to a minute. Likely experiencing bouts of SVT based on history. They occur when he is exerting himself. Recent echo and coronary CTA reviewed. He does endorse drinking a few caffeinated beverages throughout the day including diet mountain dew, celsius, and water flavor packets with added caffeine. We discussed limiting or avoiding caffeine and increasing hydration with water (can use flavor packs but without added caffeine). We discussed him wearing a monitor for the next week.  Today, he tells me he still is not feeling well.  He lay on his back and get really dizzy and nauseous.  He was on a boat and that he and went to turn quickly and got dizzy.  We discussed proper hydration when outside in the heat.  He also states that his left leg hurts when he is laying down.  It starts throbbing.  Some numbness in his left hand as well which he believes is due to a ruptured disc.  Has noticed more swelling in his leg and also pain with walking at times.  He did see his endocrinologist and even though his TSH was low his thyroid panel was normal and no further therapy was indicated.  He has not been  compliant with his CPAP which I am sure is not helping his palpitations.  He was taken off metoprolol due to erectile dysfunction.  He is willing however to try different beta-blocker to help control his heart rate a little bit better and suppress his palpitations.  He is having lots of GI issues as well and thinks it is his gallbladder.  I did suggest discussing this with primary care.   Reports no chest pain, pressure, or tightness. No edema, orthopnea, PND. Reports no palpitations.    Past Medical History    Past Medical History:  Diagnosis Date   Dysplastic nevus 05/06/2021   right thigh- anterior  mild atypia   Dyspnea    Hypertension    Past Surgical History:  Procedure Laterality Date   KNEE SURGERY Right    SPINAL FUSION      Allergies  No Known Allergies   EKGs/Labs/Other Studies Reviewed:   The following studies were reviewed today:  Coronary CTA 02/01/22  IMPRESSION: 1. Coronary calcium score of 17.9. This was 53 percentile for age-, sex, and race-matched controls.   2. Normal coronary origin with right dominance.   3. Minimal plaque in the LAD.   RECOMMENDATIONS: CAD-RADS 1: Minimal non-obstructive CAD (0-24%). Consider non-atherosclerotic causes of chest pain. Consider preventive therapy and risk factor modification.  Echo 01/28/2022  IMPRESSIONS     1. Left ventricular ejection fraction, by estimation, is 70 to  75%. The  left ventricle has hyperdynamic function. The left ventricle has no  regional wall motion abnormalities. Left ventricular diastolic parameters  were normal.   2. Right ventricular systolic function is normal. The right ventricular  size is normal.   3. The mitral valve is normal in structure. No evidence of mitral valve  regurgitation.   4. The aortic valve is normal in structure. Aortic valve regurgitation is  not visualized. No aortic stenosis is present.   5. The inferior vena cava is normal in size with greater than 50%   respiratory variability, suggesting right atrial pressure of 3 mmHg.   FINDINGS   Left Ventricle: Left ventricular ejection fraction, by estimation, is 70  to 75%. The left ventricle has hyperdynamic function. The left ventricle  has no regional wall motion abnormalities. Definity contrast agent was  given IV to delineate the left  ventricular endocardial borders. The left ventricular internal cavity size  was normal in size. There is no left ventricular hypertrophy. Left  ventricular diastolic parameters were normal.   Right Ventricle: The right ventricular size is normal. Right vetricular  wall thickness was not assessed. Right ventricular systolic function is  normal.   Left Atrium: Left atrial size was normal in size.   Right Atrium: Right atrial size was normal in size.   Pericardium: There is no evidence of pericardial effusion.   Mitral Valve: The mitral valve is normal in structure. No evidence of  mitral valve regurgitation.   Tricuspid Valve: The tricuspid valve is normal in structure. Tricuspid  valve regurgitation is trivial.   Aortic Valve: The aortic valve is normal in structure. Aortic valve  regurgitation is not visualized. No aortic stenosis is present. Aortic  valve mean gradient measures 5.0 mmHg. Aortic valve peak gradient measures  9.1 mmHg. Aortic valve area, by VTI  measures 3.43 cm.   Pulmonic Valve: The pulmonic valve was not well visualized. Pulmonic valve  regurgitation is not visualized. No evidence of pulmonic stenosis.   Aorta: The aortic root is normal in size and structure.   Venous: The inferior vena cava is normal in size with greater than 50%  respiratory variability, suggesting right atrial pressure of 3 mmHg.   IAS/Shunts: No atrial level shunt detected by color flow Doppler.   EKG:  EKG is not ordered today.    Recent Labs: 04/18/2022: BUN 18; Creatinine, Ser 0.92; Hemoglobin 12.9; Magnesium 2.0; Platelets 224; Potassium 4.6;  Sodium 142; TSH 0.199 05/30/2022: ALT 20  Recent Lipid Panel    Component Value Date/Time   CHOL 121 05/30/2022 1523   TRIG 176 (H) 05/30/2022 1523   HDL 41 05/30/2022 1523   CHOLHDL 3.0 05/30/2022 1523   LDLCALC 51 05/30/2022 1523     Home Medications   Current Meds  Medication Sig   amLODipine (NORVASC) 5 MG tablet Take 5 mg by mouth daily.   aspirin EC 81 MG tablet Take 1 tablet (81 mg total) by mouth daily. Swallow whole.   atorvastatin (LIPITOR) 10 MG tablet Take 1 tablet (10 mg total) by mouth daily.   esomeprazole (NEXIUM) 40 MG capsule Take 40 mg by mouth daily.   fluticasone (FLONASE) 50 MCG/ACT nasal spray Place 2 sprays into both nostrils daily.   hydrochlorothiazide (HYDRODIURIL) 25 MG tablet Take 1 tablet (25 mg total) by mouth daily.   olmesartan (BENICAR) 40 MG tablet TAKE ONE TABLET BY MOUTH DAILY.   oxyCODONE (OXY IR/ROXICODONE) 5 MG immediate release tablet Take 5 mg by mouth  every 4 (four) hours as needed.   sertraline (ZOLOFT) 100 MG tablet Take 50 mg by mouth daily. Pt takes 1/2 tablet 50 mg daily.   tadalafil (CIALIS) 20 MG tablet Take 20 mg by mouth daily as needed for erectile dysfunction.   Vitamin D, Ergocalciferol, (DRISDOL) 1.25 MG (50000 UNIT) CAPS capsule Take 50,000 Units by mouth once a week.     Review of Systems      All other systems reviewed and are otherwise negative except as noted above.  Physical Exam    VS:  BP 110/70   Pulse 90   Ht 5\' 10"  (1.778 m)   Wt 290 lb 3.2 oz (131.6 kg)   SpO2 96%   BMI 41.64 kg/m  , BMI Body mass index is 41.64 kg/m.  Wt Readings from Last 3 Encounters:  10/17/22 290 lb 3.2 oz (131.6 kg)  05/30/22 280 lb (127 kg)  04/18/22 278 lb 3.2 oz (126.2 kg)     GEN: Well nourished, well developed, in no acute distress. HEENT: normal. Neck: Supple, no JVD, carotid bruits, or masses. Cardiac: RRR, no murmurs, rubs, or gallops. No clubbing, cyanosis, edema.  Radials/PT 2+ and equal bilaterally.  Respiratory:   Respirations regular and unlabored, clear to auscultation bilaterally. GI: Soft, nontender, nondistended. MS: No deformity or atrophy. Skin: Warm and dry, no rash. Neuro:  Strength and sensation are intact. Psych: Normal affect.  Assessment & Plan    SOB/dizziness/lightheadedness/fatigue -Update labs, BMP, CBC -Will order updated echocardiogram to check heart pump function and valves -Will order carotid ultrasound to assess his carotid arteries -Adding carvedilol 3.125 twice a day for suppression of heart rate and palpitations  Hypertension -well controlled today -continue current medications  Obesity -increased exercise, right now limited by palpitations  Hyperlipidemia LDL goal less than 70 -lipid panel and LFTs at next appointment -continue lipitor 10mg  daily  5. Non-obstructive CAD -continue asa 81mg , lipitor 10mg  daily, norvasc 5mg  daily, Benicar 40mg  daily, add carvedilol 3.125 mg twice a day  6.  Claudication? -Other potential diagnoses include sciatica -However, does endorse pain with walking so we will order ABIs and lower extremity ultrasound to rule out PAD       Disposition: Follow up 4-6 weeks  with Orbie Pyo, MD or APP.  Signed, Sharlene Dory, PA-C 10/17/2022, 3:19 PM [facility]

## 2022-10-17 NOTE — Patient Instructions (Addendum)
Medication Instructions:    START TAKING : CARVEDILOL 3.125 MG TWICE A DAY   *If you need a refill on your cardiac medications before your next appointment, please call your pharmacy*   Lab Work:  BMET  MAG  CBC    If you have labs (blood work) drawn today and your tests are completely normal, you will receive your results only by: MyChart Message (if you have MyChart) OR A paper copy in the mail If you have any lab test that is abnormal or we need to change your treatment, we will call you to review the results.   Testing/Procedures:  Your physician has requested that you have a carotid duplex. This test is an ultrasound of the carotid arteries in your neck. It looks at blood flow through these arteries that supply the brain with blood. Allow one hour for this exam. There are no restrictions or special instructions.    Your physician has requested that you have a lower extremity arterial exercise duplex. During this test, exercise and ultrasound are used to evaluate arterial blood flow in the legs. Allow one hour for this exam. There are no restrictions or special instructions.  Your physician has requested that you have an ankle brachial index (ABI). During this test an ultrasound and blood pressure cuff are used to evaluate the arteries that supply the arms and legs with blood. Allow thirty minutes for this exam. There are no restrictions or special instructions.   Your physician has requested that you have an echocardiogram. Echocardiography is a painless test that uses sound waves to create images of your heart. It provides your doctor with information about the size and shape of your heart and how well your heart's chambers and valves are working. This procedure takes approximately one hour. There are no restrictions for this procedure. Please do NOT wear cologne, perfume, aftershave, or lotions (deodorant is allowed). Please arrive 15 minutes prior to your appointment  time.   Follow-Up: At [facility], you and your health needs are our priority.  As part of our continuing mission to provide you with exceptional heart care, we have created designated Provider Care Teams.  These Care Teams include your primary Cardiologist (physician) and Advanced Practice Providers (APPs -  Physician Assistants and Nurse Practitioners) who all work together to provide you with the care you need, when you need it.  We recommend signing up for the patient portal called "MyChart".  Sign up information is provided on this After Visit Summary.  MyChart is used to connect with patients for Virtual Visits (Telemedicine).  Patients are able to view lab/test results, encounter notes, upcoming appointments, etc.  Non-urgent messages can be sent to your provider as well.   To learn more about what you can do with MyChart, go to [email or web address].    Your next appointment:   6-8 week(s)  Provider:   CONTE- APP / OR ANY AVAILABLE  APP   Other Instructions  Low-Sodium Eating Plan Salt (sodium) helps you keep a healthy balance of fluids in your body. Too much sodium can raise your blood pressure. It can also cause fluid and waste to be held in your body. Your health care provider or dietitian may recommend a low-sodium eating plan if you have high blood pressure (hypertension), kidney disease, liver disease, or heart failure. Eating less sodium can help lower your blood pressure and reduce swelling. It can also protect your heart, liver, and kidneys. What are tips for following this plan?  Reading food labels  Check food labels for the amount of sodium per serving. If you eat more than one serving, you must multiply the listed amount by the number of servings. Choose foods with less than 140 milligrams (mg) of sodium per serving. Avoid foods with 300 mg of sodium or more per serving. Always check how much sodium is in a product, even if the label says "unsalted" or "no  salt added." Shopping  Buy products labeled as "low-sodium" or "no salt added." Buy fresh foods. Avoid canned foods and pre-made or frozen meals. Avoid canned, cured, or processed meats. Buy breads that have less than 80 mg of sodium per slice. Cooking  Eat more home-cooked food. Try to eat less restaurant, buffet, and fast food. Try not to add salt when you cook. Use salt-free seasonings or herbs instead of table salt or sea salt. Check with your provider or pharmacist before using salt substitutes. Cook with plant-based oils, such as canola, sunflower, or olive oil. Meal planning When eating at a restaurant, ask if your food can be made with less salt or no salt. Avoid dishes labeled as brined, pickled, cured, or smoked. Avoid dishes made with soy sauce, miso, or teriyaki sauce. Avoid foods that have monosodium glutamate (MSG) in them. MSG may be added to some restaurant food, sauces, soups, bouillon, and canned foods. Make meals that can be grilled, baked, poached, roasted, or steamed. These are often made with less sodium. General information Try to limit your sodium intake to 1,500-2,300 mg each day, or the amount told by your provider. What foods should I eat? Fruits Fresh, frozen, or canned fruit. Fruit juice. Vegetables Fresh or frozen vegetables. "No salt added" canned vegetables. "No salt added" tomato sauce and paste. Low-sodium or reduced-sodium tomato and vegetable juice. Grains Low-sodium cereals, such as oats, puffed wheat and rice, and shredded wheat. Low-sodium crackers. Unsalted rice. Unsalted pasta. Low-sodium bread. Whole grain breads and whole grain pasta. Meats and other proteins Fresh or frozen meat, poultry, seafood, and fish. These should have no added salt. Low-sodium canned tuna and salmon. Unsalted nuts. Dried peas, beans, and lentils without added salt. Unsalted canned beans. Eggs. Unsalted nut butters. Dairy Milk. Soy milk. Cheese that is naturally low in  sodium, such as ricotta cheese, fresh mozzarella, or Swiss cheese. Low-sodium or reduced-sodium cheese. Cream cheese. Yogurt. Seasonings and condiments Fresh and dried herbs and spices. Salt-free seasonings. Low-sodium mustard and ketchup. Sodium-free salad dressing. Sodium-free light mayonnaise. Fresh or refrigerated horseradish. Lemon juice. Vinegar. Other foods Homemade, reduced-sodium, or low-sodium soups. Unsalted popcorn and pretzels. Low-salt or salt-free chips. The items listed above may not be all the foods and drinks you can have. Talk to a dietitian to learn more. What foods should I avoid? Vegetables Sauerkraut, pickled vegetables, and relishes. Olives. [place] fries. Onion rings. Regular canned vegetables, except low-sodium or reduced-sodium items. Regular canned tomato sauce and paste. Regular tomato and vegetable juice. Frozen vegetables in sauces. Grains Instant hot cereals. Bread stuffing, pancake, and biscuit mixes. Croutons. Seasoned rice or pasta mixes. Noodle soup cups. Boxed or frozen macaroni and cheese. Regular salted crackers. Self-rising flour. Meats and other proteins Meat or fish that is salted, canned, smoked, spiced, or pickled. Precooked or cured meat, such as sausages or meat loaves. Tomasa Blase. Ham. Pepperoni. Hot dogs. Corned beef. Chipped beef. Salt pork. Jerky. Pickled herring, anchovies, and sardines. Regular canned tuna. Salted nuts. Dairy Processed cheese and cheese spreads. Hard cheeses. Cheese curds. Blue cheese. Feta cheese. String  cheese. Regular cottage cheese. Buttermilk. Canned milk. Fats and oils Salted butter. Regular margarine. Ghee. Bacon fat. Seasonings and condiments Onion salt, garlic salt, seasoned salt, table salt, and sea salt. Canned and packaged gravies. Worcestershire sauce. Tartar sauce. Barbecue sauce. Teriyaki sauce. Soy sauce, including reduced-sodium soy sauce. Steak sauce. Fish sauce. Oyster sauce. Cocktail sauce. Horseradish that you find  on the shelf. Regular ketchup and mustard. Meat flavorings and tenderizers. Bouillon cubes. Hot sauce. Pre-made or packaged marinades. Pre-made or packaged taco seasonings. Relishes. Regular salad dressings. Salsa. Other foods Salted popcorn and pretzels. Corn chips and puffs. Potato and tortilla chips. Canned or dried soups. Pizza. Frozen entrees and pot pies. The items listed above may not be all the foods and drinks you should avoid. Talk to a dietitian to learn more. This information is not intended to replace advice given to you by your health care provider. Make sure you discuss any questions you have with your health care provider. Document Revised: 04/07/2022 Document Reviewed: 04/07/2022 Elsevier Patient Education  2024 Else[employer] Inc[employer]   Heart-Healthy Eating Plan Eating a healthy diet is important for the health of your heart. A heart-healthy eating plan includes: Eating less unhealthy fats. Eating more healthy fats. Eating less salt in your food. Salt is also called sodium. Making other changes in your diet. Talk with your doctor or a diet specialist (dietitian) to create an eating plan that is right for you. What is my plan? Your doctor may recommend an eating plan that includes: Total fat: ______% or less of total calories a day. Saturated fat: ______% or less of total calories a day. Cholesterol: less than _________mg a day. Sodium: less than _________mg a day. What are tips for following this plan? Cooking Avoid frying your food. Try to bake, boil, grill, or broil it instead. You can also reduce fat by: Removing the skin from poultry. Removing all visible fats from meats. Steaming vegetables in water or broth. Meal planning  At meals, divide your plate into four equal parts: Fill one-half of your plate with vegetables and green salads. Fill one-fourth of your plate with whole grains. Fill one-fourth of your plate with lean protein foods. Eat 2-4 cups of vegetables per  day. One cup of vegetables is: 1 cup (91 g) broccoli or cauliflower florets. 2 medium carrots. 1 large bell pepper. 1 large sweet potato. 1 large tomato. 1 medium white potato. 2 cups (150 g) raw leafy greens. Eat 1-2 cups of fruit per day. One cup of fruit is: 1 small apple 1 large banana 1 cup (237 g) mixed fruit, 1 large orange,  cup (82 g) dried fruit, 1 cup (240 mL) 100% fruit juice. Eat more foods that have soluble fiber. These are apples, broccoli, carrots, beans, peas, and barley. Try to get 20-30 g of fiber per day. Eat 4-5 servings of nuts, legumes, and seeds per week: 1 serving of dried beans or legumes equals  cup (90 g) cooked. 1 serving of nuts is  oz (12 almonds, 24 pistachios, or 7 walnut halves). 1 serving of seeds equals  oz (8 g). General information Eat more home-cooked food. Eat less restaurant, buffet, and fast food. Limit or avoid alcohol. Limit foods that are high in starch and sugar. Avoid fried foods. Lose weight if you are overweight. Keep track of how much salt (sodium) you eat. This is important if you have high blood pressure. Ask your doctor to tell you more about this. Try to add vegetarian meals each week.  Fats Choose healthy fats. These include olive oil and canola oil, flaxseeds, walnuts, almonds, and seeds. Eat more omega-3 fats. These include salmon, mackerel, sardines, tuna, flaxseed oil, and ground flaxseeds. Try to eat fish at least 2 times each week. Check food labels. Avoid foods with trans fats or high amounts of saturated fat. Limit saturated fats. These are often found in animal products, such as meats, butter, and cream. These are also found in plant foods, such as palm oil, palm kernel oil, and coconut oil. Avoid foods with partially hydrogenated oils in them. These have trans fats. Examples are stick margarine, some tub margarines, cookies, crackers, and other baked goods. What foods should I eat? Fruits All fresh, canned (in  natural juice), or frozen fruits. Vegetables Fresh or frozen vegetables (raw, steamed, roasted, or grilled). Green salads. Grains Most grains. Choose whole wheat and whole grains most of the time. Rice and pasta, including brown rice and pastas made with whole wheat. Meats and other proteins Lean, well-trimmed beef, veal, pork, and lamb. Chicken and [place] without skin. All fish and shellfish. Wild duck, rabbit, pheasant, and venison. Egg whites or low-cholesterol egg substitutes. Dried beans, peas, lentils, and tofu. Seeds and most nuts. Dairy Low-fat or nonfat cheeses, including ricotta and mozzarella. Skim or 1% milk that is liquid, powdered, or evaporated. Buttermilk that is made with low-fat milk. Nonfat or low-fat yogurt. Fats and oils Non-hydrogenated (trans-free) margarines. Vegetable oils, including soybean, sesame, sunflower, olive, peanut, safflower, corn, canola, and cottonseed. Salad dressings or mayonnaise made with a vegetable oil. Beverages Mineral water. Coffee and tea. Diet carbonated beverages. Sweets and desserts Sherbet, gelatin, and fruit ice. Small amounts of dark chocolate. Limit all sweets and desserts. Seasonings and condiments All seasonings and condiments. The items listed above may not be a complete list of foods and drinks you can eat. Contact a dietitian for more options. What foods should I avoid? Fruits Canned fruit in heavy syrup. Fruit in cream or butter sauce. Fried fruit. Limit coconut. Vegetables Vegetables cooked in cheese, cream, or butter sauce. Fried vegetables. Grains Breads that are made with saturated or trans fats, oils, or whole milk. Croissants. Sweet rolls. Donuts. High-fat crackers, such as cheese crackers. Meats and other proteins Fatty meats, such as hot dogs, ribs, sausage, bacon, rib-eye roast or steak. High-fat deli meats, such as salami and bologna. Caviar. Domestic duck and goose. Organ meats, such as liver. Dairy Cream, sour  cream, cream cheese, and creamed cottage cheese. Whole-milk cheeses. Whole or 2% milk that is liquid, evaporated, or condensed. Whole buttermilk. Cream sauce or high-fat cheese sauce. Yogurt that is made from whole milk. Fats and oils Meat fat, or shortening. Cocoa butter, hydrogenated oils, palm oil, coconut oil, palm kernel oil. Solid fats and shortenings, including bacon fat, salt pork, lard, and butter. Nondairy cream substitutes. Salad dressings with cheese or sour cream. Beverages Regular sodas and juice drinks with added sugar. Sweets and desserts Frosting. Pudding. Cookies. Cakes. Pies. Milk chocolate or white chocolate. Buttered syrups. Full-fat ice cream or ice cream drinks. The items listed above may not be a complete list of foods and drinks to avoid. Contact a dietitian for more information. Summary Heart-healthy meal planning includes eating less unhealthy fats, eating more healthy fats, and making other changes in your diet. Eat a balanced diet. This includes fruits and vegetables, low-fat or nonfat dairy, lean protein, nuts and legumes, whole grains, and heart-healthy oils and fats. This information is not intended to replace advice given to  you by your health care provider. Make sure you discuss any questions you have with your health care provider. Document Revised: 04/26/2021 Document Reviewed: 04/26/2021 Elsevier Patient Education  2024 [employer].

## 2022-10-18 LAB — CBC
Hematocrit: 40 % (ref 37.5–51.0)
Hemoglobin: 13.4 g/dL (ref 13.0–17.7)
MCH: 26.8 pg (ref 26.6–33.0)
MCHC: 33.5 g/dL (ref 31.5–35.7)
MCV: 80 fL (ref 79–97)
Platelets: 305 10*3/uL (ref 150–450)
RBC: 5 x10E6/uL (ref 4.14–5.80)
RDW: 15.2 % (ref 11.6–15.4)
WBC: 10 10*3/uL (ref 3.4–10.8)

## 2022-10-18 LAB — BASIC METABOLIC PANEL
BUN/Creatinine Ratio: 19 (ref 9–20)
BUN: 21 mg/dL (ref 6–24)
CO2: 24 mmol/L (ref 20–29)
Calcium: 9.6 mg/dL (ref 8.7–10.2)
Chloride: 98 mmol/L (ref 96–106)
Creatinine, Ser: 1.12 mg/dL (ref 0.76–1.27)
Glucose: 107 mg/dL — ABNORMAL HIGH (ref 70–99)
Potassium: 4.2 mmol/L (ref 3.5–5.2)
Sodium: 141 mmol/L (ref 134–144)
eGFR: 76 mL/min/{1.73_m2} (ref >59)

## 2022-10-18 LAB — MAGNESIUM: Magnesium: 1.9 mg/dL (ref 1.6–2.3)

## 2022-11-03 ENCOUNTER — Ambulatory Visit (HOSPITAL_COMMUNITY)
Admission: RE | Admit: 2022-11-03 | Discharge: 2022-11-03 | Disposition: A | Discharge status: Home / Self Care | Payer: BC Managed Care – PPO | Source: Ambulatory Visit | Attending: Physician Assistant | Admitting: Physician Assistant

## 2022-11-03 ENCOUNTER — Ambulatory Visit (HOSPITAL_BASED_OUTPATIENT_CLINIC_OR_DEPARTMENT_OTHER)
Admission: RE | Admit: 2022-11-03 | Discharge: 2022-11-03 | Disposition: A | Discharge status: Home / Self Care | Payer: BC Managed Care – PPO | Source: Ambulatory Visit | Attending: Physician Assistant | Admitting: Physician Assistant

## 2022-11-03 DIAGNOSIS — R42 Dizziness and giddiness: Secondary | ICD-10-CM | POA: Insufficient documentation

## 2022-11-03 DIAGNOSIS — R0609 Other forms of dyspnea: Secondary | ICD-10-CM

## 2022-11-03 DIAGNOSIS — I739 Peripheral vascular disease, unspecified: Secondary | ICD-10-CM | POA: Insufficient documentation

## 2022-11-03 DIAGNOSIS — R55 Syncope and collapse: Secondary | ICD-10-CM | POA: Insufficient documentation

## 2022-11-03 LAB — VAS US ABI WITH/WO TBI
Left ABI: 1.16
Right ABI: 1.11

## 2022-11-10 ENCOUNTER — Ambulatory Visit (HOSPITAL_COMMUNITY): Payer: BC Managed Care – PPO | Attending: Internal Medicine

## 2022-11-10 DIAGNOSIS — R0609 Other forms of dyspnea: Secondary | ICD-10-CM | POA: Diagnosis present

## 2022-11-10 DIAGNOSIS — R55 Syncope and collapse: Secondary | ICD-10-CM | POA: Insufficient documentation

## 2022-11-10 DIAGNOSIS — R42 Dizziness and giddiness: Secondary | ICD-10-CM | POA: Insufficient documentation

## 2022-11-10 LAB — ECHOCARDIOGRAM COMPLETE
Area-P 1/2: 3.23 cm2
S' Lateral: 3.5 cm

## 2022-11-10 MED ORDER — PERFLUTREN LIPID MICROSPHERE
1.0000 mL | INTRAVENOUS | Status: AC | PRN
Start: 2022-11-10 — End: 2022-11-10
  Administered 2022-11-10: 2 mL via INTRAVENOUS

## 2022-11-30 NOTE — Progress Notes (Unsigned)
Office Visit    Patient Name: Reginald Hill Date of Encounter: 11/30/2022  PCP:  Eartha Inch, MD   [facility]  Cardiologist:  Orbie Pyo, MD  Advanced Practice Provider:  No care team member to display Electrophysiologist:  None   HPI    Reginald Hill is a 57 y.o. male with a past medical history significant for dyspnea, hypertension, obesity, coronary artery calcification seen on CAT scan, and hyperlipidemia presents today for follow-up appointment.  He was last seen by Dr. Lynnette Caffey and a coronary CTA and echocardiogram were ordered at that time to evaluate his dyspnea. His PCP has referred him back to Cardiology for palpitations.   He was seen by me January 2024, he tells me that he is having daily palpitations about 4-5 times a day and lasting 30 seconds to a minute. Likely experiencing bouts of SVT based on history. They occur when he is exerting himself. Recent echo and coronary CTA reviewed. He does endorse drinking a few caffeinated beverages throughout the day including diet mountain dew, celsius, and water flavor packets with added caffeine. We discussed limiting or avoiding caffeine and increasing hydration with water (can use flavor packs but without added caffeine). We discussed him wearing a monitor for the next week.  I saw the patient on 10/17/2022, he tells me he still is not feeling well.  He lay on his back and get really dizzy and nauseous.  He was on a boat and that he and went to turn quickly and got dizzy.  We discussed proper hydration when outside in the heat.  He also states that his left leg hurts when he is laying down.  It starts throbbing.  Some numbness in his left hand as well which he believes is due to a ruptured disc.  Has noticed more swelling in his leg and also pain with walking at times.  He did see his endocrinologist and even though his TSH was low his thyroid panel was normal and no further therapy was  indicated.  He has not been compliant with his CPAP which I am sure is not helping his palpitations.  He was taken off metoprolol due to erectile dysfunction.  He is willing however to try different beta-blocker to help control his heart rate a little bit better and suppress his palpitations.  He is having lots of GI issues as well and thinks it is his gallbladder.  I did suggest discussing this with primary care.   Today, he***   Past Medical History    Past Medical History:  Diagnosis Date   Dysplastic nevus 05/06/2021   right thigh- anterior  mild atypia   Dyspnea    Hypertension    Past Surgical History:  Procedure Laterality Date   KNEE SURGERY Right    SPINAL FUSION      Allergies  No Known Allergies   EKGs/Labs/Other Studies Reviewed:   The following studies were reviewed today:  Coronary CTA 02/01/22  IMPRESSION: 1. Coronary calcium score of 17.9. This was 8 percentile for age-, sex, and race-matched controls.   2. Normal coronary origin with right dominance.   3. Minimal plaque in the LAD.   RECOMMENDATIONS: CAD-RADS 1: Minimal non-obstructive CAD (0-24%). Consider non-atherosclerotic causes of chest pain. Consider preventive therapy and risk factor modification.  Echo 01/28/2022  IMPRESSIONS     1. Left ventricular ejection fraction, by estimation, is 70 to 75%. The  left ventricle has hyperdynamic function.  The left ventricle has no  regional wall motion abnormalities. Left ventricular diastolic parameters  were normal.   2. Right ventricular systolic function is normal. The right ventricular  size is normal.   3. The mitral valve is normal in structure. No evidence of mitral valve  regurgitation.   4. The aortic valve is normal in structure. Aortic valve regurgitation is  not visualized. No aortic stenosis is present.   5. The inferior vena cava is normal in size with greater than 50%  respiratory variability, suggesting right atrial pressure  of 3 mmHg.   FINDINGS   Left Ventricle: Left ventricular ejection fraction, by estimation, is 70  to 75%. The left ventricle has hyperdynamic function. The left ventricle  has no regional wall motion abnormalities. Definity contrast agent was  given IV to delineate the left  ventricular endocardial borders. The left ventricular internal cavity size  was normal in size. There is no left ventricular hypertrophy. Left  ventricular diastolic parameters were normal.   Right Ventricle: The right ventricular size is normal. Right vetricular  wall thickness was not assessed. Right ventricular systolic function is  normal.   Left Atrium: Left atrial size was normal in size.   Right Atrium: Right atrial size was normal in size.   Pericardium: There is no evidence of pericardial effusion.   Mitral Valve: The mitral valve is normal in structure. No evidence of  mitral valve regurgitation.   Tricuspid Valve: The tricuspid valve is normal in structure. Tricuspid  valve regurgitation is trivial.   Aortic Valve: The aortic valve is normal in structure. Aortic valve  regurgitation is not visualized. No aortic stenosis is present. Aortic  valve mean gradient measures 5.0 mmHg. Aortic valve peak gradient measures  9.1 mmHg. Aortic valve area, by VTI  measures 3.43 cm.   Pulmonic Valve: The pulmonic valve was not well visualized. Pulmonic valve  regurgitation is not visualized. No evidence of pulmonic stenosis.   Aorta: The aortic root is normal in size and structure.   Venous: The inferior vena cava is normal in size with greater than 50%  respiratory variability, suggesting right atrial pressure of 3 mmHg.   IAS/Shunts: No atrial level shunt detected by color flow Doppler.   EKG:  EKG is not ordered today.    Recent Labs: 04/18/2022: TSH 0.199 05/30/2022: ALT 20 10/17/2022: BUN 21; Creatinine, Ser 1.12; Hemoglobin 13.4; Magnesium 1.9; Platelets 305; Potassium 4.2; Sodium 141  Recent  Lipid Panel    Component Value Date/Time   CHOL 121 05/30/2022 1523   TRIG 176 (H) 05/30/2022 1523   HDL 41 05/30/2022 1523   CHOLHDL 3.0 05/30/2022 1523   LDLCALC 51 05/30/2022 1523     Home Medications   No outpatient medications have been marked as taking for the 12/01/22 encounter (Appointment) with Sharlene Dory, PA-C.     Review of Systems      All other systems reviewed and are otherwise negative except as noted above.  Physical Exam    VS:  There were no vitals taken for this visit. , BMI There is no height or weight on file to calculate BMI.  Wt Readings from Last 3 Encounters:  10/17/22 290 lb 3.2 oz (131.6 kg)  05/30/22 280 lb (127 kg)  04/18/22 278 lb 3.2 oz (126.2 kg)     GEN: Well nourished, well developed, in no acute distress. HEENT: normal. Neck: Supple, no JVD, carotid bruits, or masses. Cardiac: RRR, no murmurs, rubs, or gallops. No  clubbing, cyanosis, edema.  Radials/PT 2+ and equal bilaterally.  Respiratory:  Respirations regular and unlabored, clear to auscultation bilaterally. GI: Soft, nontender, nondistended. MS: No deformity or atrophy. Skin: Warm and dry, no rash. Neuro:  Strength and sensation are intact. Psych: Normal affect.  Assessment & Plan    SOB/dizziness/lightheadedness/fatigue -Update labs, BMP, CBC -Will order updated echocardiogram to check heart pump function and valves -Will order carotid ultrasound to assess his carotid arteries -Adding carvedilol 3.125 twice a day for suppression of heart rate and palpitations  Hypertension -well controlled today -continue current medications  Obesity -increased exercise, right now limited by palpitations  Hyperlipidemia LDL goal less than 70 -lipid panel and LFTs at next appointment -continue lipitor 10mg  daily  5. Non-obstructive CAD -continue asa 81mg , lipitor 10mg  daily, norvasc 5mg  daily, Benicar 40mg  daily, add carvedilol 3.125 mg twice a day  6.  Claudication? -Other  potential diagnoses include sciatica -However, does endorse pain with walking so we will order ABIs and lower extremity ultrasound to rule out PAD  No BP recorded.  {Refresh Note OR Click here to enter BP  :1}***    Disposition: Follow up 4-6 weeks  with Orbie Pyo, MD or APP.  Signed, Sharlene Dory, PA-C 11/30/2022, 8:35 PM [facility]

## 2022-12-01 ENCOUNTER — Ambulatory Visit: Payer: BC Managed Care – PPO | Attending: Physician Assistant | Admitting: Physician Assistant

## 2022-12-01 ENCOUNTER — Encounter: Payer: Self-pay | Admitting: Physician Assistant

## 2022-12-01 VITALS — BP 112/64 | HR 95 | Ht 70.0 in | Wt 287.0 lb

## 2022-12-01 DIAGNOSIS — I10 Essential (primary) hypertension: Secondary | ICD-10-CM

## 2022-12-01 DIAGNOSIS — R42 Dizziness and giddiness: Secondary | ICD-10-CM | POA: Diagnosis not present

## 2022-12-01 DIAGNOSIS — R0600 Dyspnea, unspecified: Secondary | ICD-10-CM | POA: Diagnosis not present

## 2022-12-01 DIAGNOSIS — R55 Syncope and collapse: Secondary | ICD-10-CM

## 2022-12-01 DIAGNOSIS — R0609 Other forms of dyspnea: Secondary | ICD-10-CM

## 2022-12-01 DIAGNOSIS — E785 Hyperlipidemia, unspecified: Secondary | ICD-10-CM

## 2022-12-01 DIAGNOSIS — I2510 Atherosclerotic heart disease of native coronary artery without angina pectoris: Secondary | ICD-10-CM

## 2022-12-01 MED ORDER — CARVEDILOL PHOSPHATE ER 10 MG PO CP24: 10.0000 mg | ORAL_CAPSULE | Freq: Every day | ORAL | 3 refills | Status: AC

## 2022-12-01 NOTE — Patient Instructions (Signed)
Medication Instructions:  Change coreg 3.125 mg twice daily to coreg cr 10 mg daily. *If you need a refill on your cardiac medications before your next appointment, please call your pharmacy*  Lab Work: None ordered If you have labs (blood work) drawn today and your tests are completely normal, you will receive your results only by: MyChart Message (if you have MyChart) OR A paper copy in the mail If you have any lab test that is abnormal or we need to change your treatment, we will call you to review the results.  Follow-Up: At [facility], you and your health needs are our priority.  As part of our continuing mission to provide you with exceptional heart care, we have created designated Provider Care Teams.  These Care Teams include your primary Cardiologist (physician) and Advanced Practice Providers (APPs -  Physician Assistants and Nurse Practitioners) who all work together to provide you with the care you need, when you need it.  Your next appointment:   Next available  Provider:   Orbie Pyo, MD     Heart-Healthy Eating Plan Many factors influence your heart health, including eating and exercise habits. Heart health is also called coronary health. Coronary risk increases with abnormal blood fat (lipid) levels. A heart-healthy eating plan includes limiting unhealthy fats, increasing healthy fats, limiting salt (sodium) intake, and making other diet and lifestyle changes. What is my plan? Your health care provider may recommend that: You limit your fat intake to _________% or less of your total calories each day. You limit your saturated fat intake to _________% or less of your total calories each day. You limit the amount of cholesterol in your diet to less than _________ mg per day. You limit the amount of sodium in your diet to less than _________ mg per day. What are tips for following this plan? Cooking Cook foods using methods other than frying. Baking,  boiling, grilling, and broiling are all good options. Other ways to reduce fat include: Removing the skin from poultry. Removing all visible fats from meats. Steaming vegetables in water or broth. Meal planning  At meals, imagine dividing your plate into fourths: Fill one-half of your plate with vegetables and green salads. Fill one-fourth of your plate with whole grains. Fill one-fourth of your plate with lean protein foods. Eat 2-4 cups of vegetables per day. One cup of vegetables equals 1 cup (91 g) broccoli or cauliflower florets, 2 medium carrots, 1 large bell pepper, 1 large sweet potato, 1 large tomato, 1 medium white potato, 2 cups (150 g) raw leafy greens. Eat 1-2 cups of fruit per day. One cup of fruit equals 1 small apple, 1 large banana, 1 cup (237 g) mixed fruit, 1 large orange,  cup (82 g) dried fruit, 1 cup (240 mL) 100% fruit juice. Eat more foods that contain soluble fiber. Examples include apples, broccoli, carrots, beans, peas, and barley. Aim to get 25-30 g of fiber per day. Increase your consumption of legumes, nuts, and seeds to 4-5 servings per week. One serving of dried beans or legumes equals  cup (90 g) cooked, 1 serving of nuts is  oz (12 almonds, 24 pistachios, or 7 walnut halves), and 1 serving of seeds equals  oz (8 g). Fats Choose healthy fats more often. Choose monounsaturated and polyunsaturated fats, such as olive and canola oils, avocado oil, flaxseeds, walnuts, almonds, and seeds. Eat more omega-3 fats. Choose salmon, mackerel, sardines, tuna, flaxseed oil, and ground flaxseeds. Aim to  eat fish at least 2 times each week. Check food labels carefully to identify foods with trans fats or high amounts of saturated fat. Limit saturated fats. These are found in animal products, such as meats, butter, and cream. Plant sources of saturated fats include palm oil, palm kernel oil, and coconut oil. Avoid foods with partially hydrogenated oils in them. These contain  trans fats. Examples are stick margarine, some tub margarines, cookies, crackers, and other baked goods. Avoid fried foods. General information Eat more home-cooked food and less restaurant, buffet, and fast food. Limit or avoid alcohol. Limit foods that are high in added sugar and simple starches such as foods made using white refined flour (white breads, pastries, sweets). Lose weight if you are overweight. Losing just 5-10% of your body weight can help your overall health and prevent diseases such as diabetes and heart disease. Monitor your sodium intake, especially if you have high blood pressure. Talk with your health care provider about your sodium intake. Try to incorporate more vegetarian meals weekly. What foods should I eat? Fruits All fresh, canned (in natural juice), or frozen fruits. Vegetables Fresh or frozen vegetables (raw, steamed, roasted, or grilled). Green salads. Grains Most grains. Choose whole wheat and whole grains most of the time. Rice and pasta, including brown rice and pastas made with whole wheat. Meats and other proteins Lean, well-trimmed beef, veal, pork, and lamb. Chicken and [place] without skin. All fish and shellfish. Wild duck, rabbit, pheasant, and venison. Egg whites or low-cholesterol egg substitutes. Dried beans, peas, lentils, and tofu. Seeds and most nuts. Dairy Low-fat or nonfat cheeses, including ricotta and mozzarella. Skim or 1% milk (liquid, powdered, or evaporated). Buttermilk made with low-fat milk. Nonfat or low-fat yogurt. Fats and oils Non-hydrogenated (trans-free) margarines. Vegetable oils, including soybean, sesame, sunflower, olive, avocado, peanut, safflower, corn, canola, and cottonseed. Salad dressings or mayonnaise made with a vegetable oil. Beverages Water (mineral or sparkling). Coffee and tea. Unsweetened ice tea. Diet beverages. Sweets and desserts Sherbet, gelatin, and fruit ice. Small amounts of dark chocolate. Limit all  sweets and desserts. Seasonings and condiments All seasonings and condiments. The items listed above may not be a complete list of foods and beverages you can eat. Contact a dietitian for more options. What foods should I avoid? Fruits Canned fruit in heavy syrup. Fruit in cream or butter sauce. Fried fruit. Limit coconut. Vegetables Vegetables cooked in cheese, cream, or butter sauce. Fried vegetables. Grains Breads made with saturated or trans fats, oils, or whole milk. Croissants. Sweet rolls. Donuts. High-fat crackers, such as cheese crackers and chips. Meats and other proteins Fatty meats, such as hot dogs, ribs, sausage, bacon, rib-eye roast or steak. High-fat deli meats, such as salami and bologna. Caviar. Domestic duck and goose. Organ meats, such as liver. Dairy Cream, sour cream, cream cheese, and creamed cottage cheese. Whole-milk cheeses. Whole or 2% milk (liquid, evaporated, or condensed). Whole buttermilk. Cream sauce or high-fat cheese sauce. Whole-milk yogurt. Fats and oils Meat fat, or shortening. Cocoa butter, hydrogenated oils, palm oil, coconut oil, palm kernel oil. Solid fats and shortenings, including bacon fat, salt pork, lard, and butter. Nondairy cream substitutes. Salad dressings with cheese or sour cream. Beverages Regular sodas and any drinks with added sugar. Sweets and desserts Frosting. Pudding. Cookies. Cakes. Pies. Milk chocolate or white chocolate. Buttered syrups. Full-fat ice cream or ice cream drinks. The items listed above may not be a complete list of foods and beverages to avoid. Contact a dietitian for  more information. Summary Heart-healthy meal planning includes limiting unhealthy fats, increasing healthy fats, limiting salt (sodium) intake and making other diet and lifestyle changes. Lose weight if you are overweight. Losing just 5-10% of your body weight can help your overall health and prevent diseases such as diabetes and heart disease. Focus on  eating a balance of foods, including fruits and vegetables, low-fat or nonfat dairy, lean protein, nuts and legumes, whole grains, and heart-healthy oils and fats. This information is not intended to replace advice given to you by your health care provider. Make sure you discuss any questions you have with your health care provider. Document Revised: 04/26/2021 Document Reviewed: 04/26/2021 Elsevier Patient Education  2024 [employer] Low-Sodium Eating Plan Salt (sodium) helps you keep a healthy balance of fluids in your body. Too much sodium can raise your blood pressure. It can also cause fluid and waste to be held in your body. Your health care provider or dietitian may recommend a low-sodium eating plan if you have high blood pressure (hypertension), kidney disease, liver disease, or heart failure. Eating less sodium can help lower your blood pressure and reduce swelling. It can also protect your heart, liver, and kidneys. What are tips for following this plan? Reading food labels  Check food labels for the amount of sodium per serving. If you eat more than one serving, you must multiply the listed amount by the number of servings. Choose foods with less than 140 milligrams (mg) of sodium per serving. Avoid foods with 300 mg of sodium or more per serving. Always check how much sodium is in a product, even if the label says "unsalted" or "no salt added." Shopping  Buy products labeled as "low-sodium" or "no salt added." Buy fresh foods. Avoid canned foods and pre-made or frozen meals. Avoid canned, cured, or processed meats. Buy breads that have less than 80 mg of sodium per slice. Cooking  Eat more home-cooked food. Try to eat less restaurant, buffet, and fast food. Try not to add salt when you cook. Use salt-free seasonings or herbs instead of table salt or sea salt. Check with your provider or pharmacist before using salt substitutes. Cook with plant-based oils, such as canola,  sunflower, or olive oil. Meal planning When eating at a restaurant, ask if your food can be made with less salt or no salt. Avoid dishes labeled as brined, pickled, cured, or smoked. Avoid dishes made with soy sauce, miso, or teriyaki sauce. Avoid foods that have monosodium glutamate (MSG) in them. MSG may be added to some restaurant food, sauces, soups, bouillon, and canned foods. Make meals that can be grilled, baked, poached, roasted, or steamed. These are often made with less sodium. General information Try to limit your sodium intake to 1,500-2,300 mg each day, or the amount told by your provider. What foods should I eat? Fruits Fresh, frozen, or canned fruit. Fruit juice. Vegetables Fresh or frozen vegetables. "No salt added" canned vegetables. "No salt added" tomato sauce and paste. Low-sodium or reduced-sodium tomato and vegetable juice. Grains Low-sodium cereals, such as oats, puffed wheat and rice, and shredded wheat. Low-sodium crackers. Unsalted rice. Unsalted pasta. Low-sodium bread. Whole grain breads and whole grain pasta. Meats and other proteins Fresh or frozen meat, poultry, seafood, and fish. These should have no added salt. Low-sodium canned tuna and salmon. Unsalted nuts. Dried peas, beans, and lentils without added salt. Unsalted canned beans. Eggs. Unsalted nut butters. Dairy Milk. Soy milk. Cheese that is naturally low in sodium, such  as ricotta cheese, fresh mozzarella, or Swiss cheese. Low-sodium or reduced-sodium cheese. Cream cheese. Yogurt. Seasonings and condiments Fresh and dried herbs and spices. Salt-free seasonings. Low-sodium mustard and ketchup. Sodium-free salad dressing. Sodium-free light mayonnaise. Fresh or refrigerated horseradish. Lemon juice. Vinegar. Other foods Homemade, reduced-sodium, or low-sodium soups. Unsalted popcorn and pretzels. Low-salt or salt-free chips. The items listed above may not be all the foods and drinks you can have. Talk to a  dietitian to learn more. What foods should I avoid? Vegetables Sauerkraut, pickled vegetables, and relishes. Olives. [place] fries. Onion rings. Regular canned vegetables, except low-sodium or reduced-sodium items. Regular canned tomato sauce and paste. Regular tomato and vegetable juice. Frozen vegetables in sauces. Grains Instant hot cereals. Bread stuffing, pancake, and biscuit mixes. Croutons. Seasoned rice or pasta mixes. Noodle soup cups. Boxed or frozen macaroni and cheese. Regular salted crackers. Self-rising flour. Meats and other proteins Meat or fish that is salted, canned, smoked, spiced, or pickled. Precooked or cured meat, such as sausages or meat loaves. Tomasa Blase. Ham. Pepperoni. Hot dogs. Corned beef. Chipped beef. Salt pork. Jerky. Pickled herring, anchovies, and sardines. Regular canned tuna. Salted nuts. Dairy Processed cheese and cheese spreads. Hard cheeses. Cheese curds. Blue cheese. Feta cheese. String cheese. Regular cottage cheese. Buttermilk. Canned milk. Fats and oils Salted butter. Regular margarine. Ghee. Bacon fat. Seasonings and condiments Onion salt, garlic salt, seasoned salt, table salt, and sea salt. Canned and packaged gravies. Worcestershire sauce. Tartar sauce. Barbecue sauce. Teriyaki sauce. Soy sauce, including reduced-sodium soy sauce. Steak sauce. Fish sauce. Oyster sauce. Cocktail sauce. Horseradish that you find on the shelf. Regular ketchup and mustard. Meat flavorings and tenderizers. Bouillon cubes. Hot sauce. Pre-made or packaged marinades. Pre-made or packaged taco seasonings. Relishes. Regular salad dressings. Salsa. Other foods Salted popcorn and pretzels. Corn chips and puffs. Potato and tortilla chips. Canned or dried soups. Pizza. Frozen entrees and pot pies. The items listed above may not be all the foods and drinks you should avoid. Talk to a dietitian to learn more. This information is not intended to replace advice given to you by your health  care provider. Make sure you discuss any questions you have with your health care provider. Document Revised: 04/07/2022 Document Reviewed: 04/07/2022 Elsevier Patient Education  2024 [employer].

## 2022-12-06 ENCOUNTER — Other Ambulatory Visit: Payer: Self-pay | Admitting: *Deleted

## 2022-12-07 NOTE — Progress Notes (Signed)
I have not ordered either. When I typed cardiopulmonary stress there were several different options.

## 2022-12-08 ENCOUNTER — Encounter: Payer: Self-pay | Admitting: *Deleted

## 2022-12-08 ENCOUNTER — Telehealth: Payer: Self-pay | Admitting: *Deleted

## 2022-12-08 NOTE — Addendum Note (Signed)
Addended by: Sharlene Dory on: 12/08/2022 05:21 PM   Modules accepted: Orders

## 2022-12-08 NOTE — Telephone Encounter (Signed)
-----   Message from Sharlene Dory sent at 12/01/2022  5:10 PM EDT ----- I will be able to order a cardiopulmonary stress test?  Reason would be DOE.  Thanks.  Sharlene Dory, PA-C ----- Message ----- From: Orbie Pyo, MD Sent: 12/01/2022  12:45 PM EDT To: Sharlene Dory, PA-C  How about a cardiopulmonary stress test which would rule out/in cardiac/pulmonary/deconditioning? ----- Message ----- From: Bunnie Domino Sent: 12/01/2022  12:42 PM EDT To: Orbie Pyo, MD  No cardiac reason for his shortness of breath and other symptoms.  Full workup was reviewed again with the patient.  He is very frustrated and really wants an answer as to why he is feeling this way.  We discussed weight loss and follow-up with a pulmonologist.  Thanks.

## 2022-12-08 NOTE — Addendum Note (Signed)
Addended by: Burnetta Sabin on: 12/08/2022 11:43 AM   Modules accepted: Orders

## 2022-12-08 NOTE — Telephone Encounter (Signed)
Spoke with pt.   He is aware that we have ordered a cardipulmonary stress test and someone will reach out to him to get it scheduled.  Per pt, instructions will be sent via mychart.

## 2022-12-08 NOTE — Addendum Note (Signed)
Addended by: Burnetta Sabin on: 12/08/2022 11:44 AM   Modules accepted: Orders

## 2023-01-05 NOTE — Telephone Encounter (Addendum)
Called patient to see schedule an appt with Dr. Lynnette Caffey due to him being on the wait list.  Patient states he is supposed to have this test first.   Advised patient a message would be sent regarding the hold up on scheduling due to our scheduling department being unable to schedule testing at the hospital.   Please advise.

## 2023-01-06 NOTE — Addendum Note (Signed)
Addended by: Burnetta Sabin on: 01/06/2023 09:16 AM   Modules accepted: Orders

## 2023-01-06 NOTE — Addendum Note (Signed)
Addended by: Burnetta Sabin on: 01/06/2023 06:47 AM   Modules accepted: Orders

## 2023-01-10 ENCOUNTER — Ambulatory Visit (HOSPITAL_COMMUNITY): Payer: BC Managed Care – PPO | Attending: Internal Medicine

## 2023-01-10 ENCOUNTER — Other Ambulatory Visit (HOSPITAL_COMMUNITY): Payer: Self-pay | Admitting: *Deleted

## 2023-01-10 DIAGNOSIS — I2510 Atherosclerotic heart disease of native coronary artery without angina pectoris: Secondary | ICD-10-CM | POA: Diagnosis not present

## 2023-01-10 DIAGNOSIS — I10 Essential (primary) hypertension: Secondary | ICD-10-CM

## 2023-01-10 DIAGNOSIS — R0609 Other forms of dyspnea: Secondary | ICD-10-CM

## 2023-01-10 DIAGNOSIS — R0600 Dyspnea, unspecified: Secondary | ICD-10-CM

## 2023-01-10 DIAGNOSIS — R55 Syncope and collapse: Secondary | ICD-10-CM | POA: Diagnosis not present

## 2023-01-10 DIAGNOSIS — E785 Hyperlipidemia, unspecified: Secondary | ICD-10-CM | POA: Insufficient documentation

## 2023-01-10 DIAGNOSIS — R42 Dizziness and giddiness: Secondary | ICD-10-CM | POA: Insufficient documentation

## 2023-01-11 ENCOUNTER — Other Ambulatory Visit: Payer: Self-pay | Admitting: Internal Medicine

## 2023-01-11 DIAGNOSIS — I10 Essential (primary) hypertension: Secondary | ICD-10-CM

## 2023-01-12 ENCOUNTER — Telehealth: Payer: Self-pay | Admitting: Internal Medicine

## 2023-01-12 NOTE — Telephone Encounter (Signed)
Patient is requesting call to discuss results of exercise test and to also discuss follow-up appt. Please advise.

## 2023-01-13 NOTE — Telephone Encounter (Signed)
Returned call to pt, left a message for pt to call back.  

## 2023-01-19 ENCOUNTER — Encounter: Payer: Self-pay | Admitting: Internal Medicine

## 2023-01-20 ENCOUNTER — Telehealth: Payer: Self-pay

## 2023-01-20 NOTE — Telephone Encounter (Signed)
   Pre-operative Risk Assessment    Patient Name: Reginald Hill  DOB: 1965-11-25 MRN: [id number]   Last office visit 12/01/22 with Jari Favre PA-C  No upcoming visits   Request for Surgical Clearance    Procedure:  Colonoscopy/ endoscopy   Date of Surgery:  Clearance TBD                                 Surgeon:  Dr. Kelli Hope  Surgeon's Group or Practice Name:  [facility] Phone number:  [phone] Fax number:  [phone]   Type of Clearance Requested:   - Medical    Type of Anesthesia:  Propofol    Additional requests/questions:    Scarlette Shorts   01/20/2023, 4:52 PM

## 2023-01-23 NOTE — Telephone Encounter (Signed)
     Primary Cardiologist: Orbie Pyo, MD  Chart reviewed as part of pre-operative protocol coverage. Given past medical history and time since last visit, based on ACC/AHA guidelines, ZVI FURSE would be at acceptable risk for the planned procedure without further cardiovascular testing.     I will route this recommendation to the requesting party via Epic fax function and remove from pre-op pool.  Please call with questions.  Thomasene Ripple. Emmanuelle Coxe NP-C     01/23/2023, 11:15 AM [facility] [address] Office [phone] Fax [phone]

## 2023-02-04 ENCOUNTER — Other Ambulatory Visit: Payer: Self-pay | Admitting: Internal Medicine

## 2023-05-11 ENCOUNTER — Other Ambulatory Visit: Payer: Self-pay | Admitting: Internal Medicine

## 2023-05-30 ENCOUNTER — Other Ambulatory Visit: Payer: Self-pay | Admitting: Internal Medicine

## 2023-05-30 DIAGNOSIS — I10 Essential (primary) hypertension: Secondary | ICD-10-CM

## 2023-07-20 ENCOUNTER — Ambulatory Visit: Payer: BC Managed Care – PPO | Admitting: Dermatology

## 2023-08-05 NOTE — Progress Notes (Unsigned)
 Cardiology Office Note:   Date:  08/07/2023  ID:  Reginald Hill, DOB Dec 07, 1965, MRN [id number] PCP:  Emaline Handsome, MD  [facility] Providers Cardiologist:  Alyssa Backbone, MD Referring MD: Emaline Handsome, MD  Chief Complaint/Reason for Referral: Follow-up for coronary artery disease, dyspnea ASSESSMENT:    1. Dyspnea on exertion   2. Mild CAD   3. Hyperlipidemia LDL goal <70   4. Primary hypertension   5. CKD (chronic kidney disease) stage 2, GFR 60-89 ml/min   6. BMI 40.0-44.9, adult (HCC)     PLAN:   In order of problems listed above: Dyspnea: Reassuring cardiopulmonary exercise stress test with no evidence of cardiac or pulmonary causes for dyspnea.  Likely due to body habitus.  He is altering his diet in order to lose weight. Coronary artery disease: Mild on coronary CTA.  Continue aspirin  81 mg, atorvastatin  10 mg.  He stopped his aspirin  however I stressed the need to restart it.  He will restart aspirin  81 mg. Hyperlipidemia: Continue atorvastatin  10 mg.  Check lipid panel and LFTs today. Hypertension: Continue amlodipine 5 mg, hydrochlorothiazide 25 mg, olmesartan 40 mg.  Blood pressure is well-controlled.  He has had some issues with mild orthostasis.  I have suggested that he take his once a day blood pressure medications at night. CKD stage II: Continue olmesartan 40 mg daily. Elevated BMI: Check hemoglobin A1c for screening purposes; if elevated will refer to pharmacy for recommendations regarding GLP-1 receptor agonist therapy.            Dispo:  Return in about 1 year (around 08/06/2024).      Medication Adjustments/Labs and Tests Ordered: Current medicines are reviewed at length with the patient today.  Concerns regarding medicines are outlined above.  The following changes have been made:     Labs/tests ordered: Orders Placed This Encounter  Procedures   Lipid panel   Hepatic function panel   Hemoglobin A1c   EKG 12-Lead    Medication  Changes: No orders of the defined types were placed in this encounter.   Current medicines are reviewed at length with the patient today.  The patient does not have concerns regarding medicines.  I spent 33 minutes reviewing all clinical data during and prior to this visit including all relevant imaging studies, laboratories, clinical information from other health systems and prior notes from both Cardiology and other specialties, interviewing the patient, conducting a complete physical examination, and coordinating care in order to formulate a comprehensive and personalized evaluation and treatment plan.   History of Present Illness:      FOCUSED PROBLEM LIST:   CAD Minimal, coronary CTA 2023 EF 55 to 60%, normal diastolic dysfunction TTE 2024 HL LpA < 8.4 HTN BMI 41 OSA Not entirely compliant with CPAP CKD II Prediabetes Hemoglobin A1c 5.12 April 2022  October 2023 consultation: The patient is a 58 y.o. male with the indicated medical history here for recommendations regarding dyspnea.  Additionally the patient is to undergo orthopedic surgery under general anesthesia on his right foot.  Plan: Obtain coronary CTA and echocardiogram, start aspirin  and atorvastatin .   February 2024: In the interim the patient had a reassuring coronary CTA which showed only mild obstructive coronary artery disease and a reassuring echocardiogram.  He was seen in our clinic due to palpitations and monitor showed symptomatic SVT and PACs.  He was started on metoprolol  12.5 mg twice daily.  His TSH was found to be low and this result  was passed on to his primary care provider's office.   He has noticed that the metoprolol  has helped his palpitations but he still gets them especially when he exerts himself.  Of note he was diagnosed with severe obstructive sleep apnea but has not been entirely compliant with his CPAP.  He does have issues with erectile dysfunction for a long time and has been taking Cialis  but has noticed recently that it seems to be a little bit worse.  He denies any exertional angina.  He does have chronic dyspnea that is not changed.  His PCP is trying to get him Wegovy due to his BMI.  He fortunately is not required any emergency room visits or hospitalizations.  Plan: Check lipid panel, LFTs, LP(a); stop metoprolol  due to erectile dysfunction.  May 2025:  Patient consents to use of AI scribe. In the interim his LDL was 51 and his LP(a) was not elevated.  He was seen by cardiology in July 2024.  He was having problems with palpitations and carvedilol  3.125 mg twice daily was ordered.  Due to potential claudication symptoms he was referred for ABIs which were reassuring.  He also had carotid Dopplers that showed no significant obstructive disease.  He eventually got a cardiopulmonary exercise stress test due to dyspnea.  This suggested that his body habitus was likely the cause of his dyspnea and there is no cardiac or pulmonary cause found.  He has been experiencing lightheadedness for the past week, particularly when transitioning from sitting to standing or bending over. He attributes this to inconsistent intake of his blood pressure medications, which he takes at varying times, either in the morning or afternoon. He is on multiple antihypertensive medications, though he did not specify their names.  He was prescribed Coreg  for palpitations but has discontinued it as he no longer experiences these symptoms. No current palpitations are reported.  He is concerned about his weight, currently at 257 pounds, and aims to reduce it to 230 pounds. He is attempting weight loss by eating less frequently and fasting more often. He reports knee pain for the last six months, which he believes is related to his weight.  He has a history of mild coronary artery blockages and was previously on aspirin , which he stopped after running out. He mentions that his cholesterol was checked today, and he was  last tested for diabetes over a year ago.  He is planning a cruise to [place] in June and has other family activities planned for the summer.        Current Medications: Current Meds  Medication Sig   amLODipine (NORVASC) 5 MG tablet Take 5 mg by mouth daily.   aspirin  EC 81 MG tablet Take 1 tablet (81 mg total) by mouth daily. Swallow whole.   atorvastatin  (LIPITOR) 10 MG tablet TAKE 1 TABLET BY MOUTH EVERY DAY   hydrochlorothiazide (HYDRODIURIL) 25 MG tablet TAKE 1 TABLET (25 MG TOTAL) BY MOUTH DAILY.   olmesartan (BENICAR) 40 MG tablet TAKE 1 TABLET BY MOUTH EVERY DAY   omeprazole (PRILOSEC) 40 MG capsule Take 40 mg by mouth daily.   oxyCODONE (OXY IR/ROXICODONE) 5 MG immediate release tablet Take 5 mg by mouth every 4 (four) hours as needed.   sertraline (ZOLOFT) 100 MG tablet Take 50 mg by mouth daily. Pt takes 1/2 tablet 50 mg daily.   tadalafil (CIALIS) 20 MG tablet Take 20 mg by mouth daily as needed for erectile dysfunction.   Vitamin D, Ergocalciferol, (DRISDOL)  1.25 MG (50000 UNIT) CAPS capsule Take 50,000 Units by mouth once a week.     Review of Systems:   Please see the history of present illness.    All other systems reviewed and are negative.     EKGs/Labs/Other Test Reviewed:   EKG: 2024 normal sinus rhythm  EKG Interpretation Date/Time:  Monday Aug 07 2023 11:02:32 EDT Ventricular Rate:  74 PR Interval:  148 QRS Duration:  80 QT Interval:  368 QTC Calculation: 408 R Axis:   36  Text Interpretation: Normal sinus rhythm Normal ECG When compared with ECG of 17-Oct-2022 15:01, No significant change was found Confirmed by Alyssa Backbone ([id number]) on 08/07/2023 11:05:51 AM         Risk Assessment/Calculations:          Physical Exam:   VS:  BP 110/74   Pulse 78   Ht 5\' 10"  (1.778 m)   Wt 257 lb (116.6 kg)   SpO2 96%   BMI 36.88 kg/m        Wt Readings from Last 3 Encounters:  08/07/23 257 lb (116.6 kg)  12/01/22 287 lb 0.6 oz (130.2 kg)  10/17/22 290  lb 3.2 oz (131.6 kg)      GENERAL:  No apparent distress, AOx3 HEENT:  No carotid bruits, +2 carotid impulses, no scleral icterus CAR: RRR no murmurs, gallops, rubs, or thrills RES:  Clear to auscultation bilaterally ABD:  Soft, nontender, nondistended, positive bowel sounds x 4 VASC:  +2 radial pulses, +2 carotid pulses NEURO:  CN 2-12 grossly intact; motor and sensory grossly intact PSYCH:  No active depression or anxiety EXT:  No edema, ecchymosis, or cyanosis  Signed, Raliyah Montella K Laurna Shetley, MD  08/07/2023 11:24 AM    [facility] [address] [place], [place], [place]  [address] Phone: [phone]; Fax: [phone]   Note:  This document was prepared using Dragon voice recognition software and may include unintentional dictation errors.

## 2023-08-07 ENCOUNTER — Encounter: Payer: Self-pay | Admitting: Internal Medicine

## 2023-08-07 ENCOUNTER — Other Ambulatory Visit: Payer: Self-pay | Admitting: *Deleted

## 2023-08-07 ENCOUNTER — Ambulatory Visit: Attending: Internal Medicine | Admitting: Internal Medicine

## 2023-08-07 VITALS — BP 110/74 | HR 78 | Ht 70.0 in | Wt 257.0 lb

## 2023-08-07 DIAGNOSIS — E785 Hyperlipidemia, unspecified: Secondary | ICD-10-CM

## 2023-08-07 DIAGNOSIS — I10 Essential (primary) hypertension: Secondary | ICD-10-CM | POA: Diagnosis not present

## 2023-08-07 DIAGNOSIS — I2510 Atherosclerotic heart disease of native coronary artery without angina pectoris: Secondary | ICD-10-CM

## 2023-08-07 DIAGNOSIS — N182 Chronic kidney disease, stage 2 (mild): Secondary | ICD-10-CM

## 2023-08-07 DIAGNOSIS — R0609 Other forms of dyspnea: Secondary | ICD-10-CM

## 2023-08-07 DIAGNOSIS — Z6841 Body Mass Index (BMI) 40.0 and over, adult: Secondary | ICD-10-CM

## 2023-08-07 LAB — LIPID PANEL

## 2023-08-07 NOTE — Patient Instructions (Signed)
 Medication Instructions:  Restart aspirin  81 mg daily  *If you need a refill on your cardiac medications before your next appointment, please call your pharmacy*  Lab Work: Today: lipids, liver function and hgA1c  If you have labs (blood work) drawn today and your tests are completely normal, you will receive your results only by: MyChart Message (if you have MyChart) OR A paper copy in the mail If you have any lab test that is abnormal or we need to change your treatment, we will call you to review the results.  Testing/Procedures: none  Follow-Up: At [facility], you and your health needs are our priority.  As part of our continuing mission to provide you with exceptional heart care, our providers are all part of one team.  This team includes your primary Cardiologist (physician) and Advanced Practice Providers or APPs (Physician Assistants and Nurse Practitioners) who all work together to provide you with the care you need, when you need it.  Your next appointment:   12 month(s)  Provider:   Lovette Rud, PA-C       Other Instructions

## 2023-08-08 ENCOUNTER — Encounter: Payer: Self-pay | Admitting: Internal Medicine

## 2023-08-08 LAB — HEPATIC FUNCTION PANEL
ALT: 19 IU/L (ref 0–44)
AST: 16 IU/L (ref 0–40)
Albumin: 4.7 g/dL (ref 3.8–4.9)
Alkaline Phosphatase: 93 IU/L (ref 44–121)
Bilirubin Total: 0.9 mg/dL (ref 0.0–1.2)
Bilirubin, Direct: 0.32 mg/dL (ref 0.00–0.40)
Total Protein: 7.4 g/dL (ref 6.0–8.5)

## 2023-08-08 LAB — HEMOGLOBIN A1C
Est. average glucose Bld gHb Est-mCnc: 126 mg/dL
Hgb A1c MFr Bld: 6 % — ABNORMAL HIGH (ref 4.8–5.6)

## 2023-08-08 LAB — LIPID PANEL
Cholesterol, Total: 94 mg/dL — ABNORMAL LOW (ref 100–199)
HDL: 42 mg/dL (ref >39)
LDL CALC COMMENT:: 2.2 ratio (ref 0.0–5.0)
LDL Chol Calc (NIH): 31 mg/dL (ref 0–99)
Triglycerides: 113 mg/dL (ref 0–149)
VLDL Cholesterol Cal: 21 mg/dL (ref 5–40)

## 2023-09-02 ENCOUNTER — Other Ambulatory Visit: Payer: Self-pay | Admitting: Internal Medicine
